# Patient Record
Sex: Female | Born: 1955 | Race: White | Hispanic: No | Smoking: Former smoker
Health system: Southern US, Community
[De-identification: ages and names within clinical notes are randomized; demographics above are authoritative.]

## PROBLEM LIST (undated history)

## (undated) DIAGNOSIS — C801 Malignant (primary) neoplasm, unspecified: Secondary | ICD-10-CM

## (undated) DIAGNOSIS — R112 Nausea with vomiting, unspecified: Secondary | ICD-10-CM

## (undated) DIAGNOSIS — T8859XA Other complications of anesthesia, initial encounter: Secondary | ICD-10-CM

## (undated) DIAGNOSIS — F988 Other specified behavioral and emotional disorders with onset usually occurring in childhood and adolescence: Secondary | ICD-10-CM

## (undated) DIAGNOSIS — Z9889 Other specified postprocedural states: Secondary | ICD-10-CM

## (undated) DIAGNOSIS — T4145XA Adverse effect of unspecified anesthetic, initial encounter: Secondary | ICD-10-CM

## (undated) DIAGNOSIS — I10 Essential (primary) hypertension: Secondary | ICD-10-CM

## (undated) HISTORY — PX: MELANOMA EXCISION WITH SENTINEL LYMPH NODE BIOPSY: SHX5267

## (undated) HISTORY — PX: JOINT REPLACEMENT: SHX530

## (undated) HISTORY — PX: ABDOMINAL HYSTERECTOMY: SHX81

## (undated) HISTORY — PX: AUGMENTATION MAMMAPLASTY: SUR837

---

## 1998-07-03 ENCOUNTER — Encounter: Payer: Self-pay | Admitting: General Surgery

## 1998-07-03 ENCOUNTER — Ambulatory Visit (HOSPITAL_COMMUNITY): Admission: RE | Admit: 1998-07-03 | Discharge: 1998-07-03 | Payer: Self-pay | Admitting: General Surgery

## 1998-07-11 ENCOUNTER — Encounter: Payer: Self-pay | Admitting: General Surgery

## 1998-07-17 ENCOUNTER — Ambulatory Visit (HOSPITAL_COMMUNITY): Admission: RE | Admit: 1998-07-17 | Discharge: 1998-07-18 | Payer: Self-pay | Admitting: General Surgery

## 1998-09-05 ENCOUNTER — Other Ambulatory Visit: Admission: RE | Admit: 1998-09-05 | Discharge: 1998-09-05 | Payer: Self-pay | Admitting: Obstetrics and Gynecology

## 1999-09-09 ENCOUNTER — Other Ambulatory Visit: Admission: RE | Admit: 1999-09-09 | Discharge: 1999-09-09 | Payer: Self-pay | Admitting: Obstetrics and Gynecology

## 2000-01-07 ENCOUNTER — Encounter: Admission: RE | Admit: 2000-01-07 | Discharge: 2000-01-07 | Payer: Self-pay | Admitting: Hematology and Oncology

## 2000-01-07 ENCOUNTER — Encounter: Payer: Self-pay | Admitting: Hematology and Oncology

## 2000-09-15 ENCOUNTER — Other Ambulatory Visit: Admission: RE | Admit: 2000-09-15 | Discharge: 2000-09-15 | Payer: Self-pay | Admitting: Obstetrics and Gynecology

## 2001-10-18 ENCOUNTER — Other Ambulatory Visit: Admission: RE | Admit: 2001-10-18 | Discharge: 2001-10-18 | Payer: Self-pay | Admitting: Obstetrics and Gynecology

## 2002-01-24 ENCOUNTER — Encounter: Payer: Self-pay | Admitting: Hematology and Oncology

## 2002-01-24 ENCOUNTER — Ambulatory Visit (HOSPITAL_COMMUNITY): Admission: RE | Admit: 2002-01-24 | Discharge: 2002-01-24 | Payer: Self-pay | Admitting: Hematology and Oncology

## 2002-08-03 ENCOUNTER — Encounter: Admission: RE | Admit: 2002-08-03 | Discharge: 2002-08-03 | Payer: Self-pay | Admitting: Internal Medicine

## 2002-08-03 ENCOUNTER — Encounter: Payer: Self-pay | Admitting: Internal Medicine

## 2002-12-26 ENCOUNTER — Other Ambulatory Visit: Admission: RE | Admit: 2002-12-26 | Discharge: 2002-12-26 | Payer: Self-pay | Admitting: Obstetrics and Gynecology

## 2008-11-08 ENCOUNTER — Encounter (INDEPENDENT_AMBULATORY_CARE_PROVIDER_SITE_OTHER): Payer: Self-pay | Admitting: Obstetrics and Gynecology

## 2008-11-08 ENCOUNTER — Ambulatory Visit (HOSPITAL_COMMUNITY): Admission: RE | Admit: 2008-11-08 | Discharge: 2008-11-09 | Payer: Self-pay | Admitting: Obstetrics and Gynecology

## 2010-09-20 ENCOUNTER — Encounter (INDEPENDENT_AMBULATORY_CARE_PROVIDER_SITE_OTHER): Payer: Self-pay | Admitting: *Deleted

## 2010-09-24 NOTE — Letter (Signed)
Summary: Pre Visit Letter Revised  [facility]  [address] [place], [place] [address]   Phone: [phone]  Fax: [phone]        09/20/2010 MRN: [id number] [name] [address] [place], [place]  [address]             Procedure Date:  10-31-10           Direct Colon---Dr. Juanda Chance   Welcome to the Gastroenterology Division at [facility].    You are scheduled to see a nurse for your pre-procedure visit on 10-17-10 at 11:00a.m. on the 3rd floor at [facility], [address]  We ask that you try to arrive at our office 15 minutes prior to your appointment time to allow for check-in.  Please take a minute to review the attached form.  If you answer "Yes" to one or more of the questions on the first page, we ask that you call the person listed at your earliest opportunity.  If you answer "No" to all of the questions, please complete the rest of the form and bring it to your appointment.    Your nurse visit will consist of discussing your medical and surgical history, your immediate family medical history, and your medications.   If you are unable to list all of your medications on the form, please bring the medication bottles to your appointment and we will list them.  We will need to be aware of both prescribed and over the counter drugs.  We will need to know exact dosage information as well.    Please be prepared to read and sign documents such as consent forms, a financial agreement, and acknowledgement forms.  If necessary, and with your consent, a friend or relative is welcome to sit-in on the nurse visit with you.  Please bring your insurance card so that we may make a copy of it.  If your insurance requires a referral to see a specialist, please bring your referral form from your primary care physician.  No co-pay is required for this nurse visit.     If you cannot keep your appointment, please call [phone] to cancel or reschedule prior to  your appointment date.  This allows [place] the opportunity to schedule an appointment for another patient in need of care.    Thank you for choosing  Gastroenterology for your medical needs.  We appreciate the opportunity to care for you.  Please visit [place] at our website  to learn more about our practice.  Sincerely, The Gastroenterology Division

## 2010-10-23 ENCOUNTER — Ambulatory Visit (AMBULATORY_SURGERY_CENTER): Payer: 59 | Admitting: *Deleted

## 2010-10-23 VITALS — Ht 65.0 in | Wt 120.0 lb

## 2010-10-23 DIAGNOSIS — Z1211 Encounter for screening for malignant neoplasm of colon: Secondary | ICD-10-CM

## 2010-10-23 LAB — COMPREHENSIVE METABOLIC PANEL
ALT: 15 U/L (ref 0–35)
Alkaline Phosphatase: 49 U/L (ref 39–117)
BUN: 11 mg/dL (ref 6–23)
CO2: 27 mEq/L (ref 19–32)
Calcium: 9 mg/dL (ref 8.4–10.5)
GFR calc non Af Amer: >60 mL/min (ref >60)
Glucose, Bld: 131 mg/dL — ABNORMAL HIGH (ref 70–99)
Potassium: 3.9 mEq/L (ref 3.5–5.1)
Sodium: 139 mEq/L (ref 135–145)
Total Protein: 6.8 g/dL (ref 6.0–8.3)

## 2010-10-23 LAB — CBC
HCT: 27.9 % — ABNORMAL LOW (ref 36.0–46.0)
HCT: 43.4 % (ref 36.0–46.0)
Hemoglobin: 15.1 g/dL — ABNORMAL HIGH (ref 12.0–15.0)
Hemoglobin: 9 g/dL — ABNORMAL LOW (ref 12.0–15.0)
Hemoglobin: 9.8 g/dL — ABNORMAL LOW (ref 12.0–15.0)
MCHC: 34.8 g/dL (ref 30.0–36.0)
MCHC: 35.1 g/dL (ref 30.0–36.0)
MCV: 97.7 fL (ref 78.0–100.0)
RBC: 2.66 MIL/uL — ABNORMAL LOW (ref 3.87–5.11)
RBC: 4.5 MIL/uL (ref 3.87–5.11)
RDW: 13.1 % (ref 11.5–15.5)
RDW: 13.2 % (ref 11.5–15.5)
WBC: 7 10*3/uL (ref 4.0–10.5)

## 2010-10-23 LAB — DIFFERENTIAL
Basophils Relative: 1 % (ref 0–1)
Eosinophils Absolute: 0.3 10*3/uL (ref 0.0–0.7)
Monocytes Relative: 9 % (ref 3–12)
Neutro Abs: 4.4 10*3/uL (ref 1.7–7.7)
Neutrophils Relative %: 67 % (ref 43–77)

## 2010-10-23 LAB — GLUCOSE, RANDOM: Glucose, Bld: 111 mg/dL — ABNORMAL HIGH (ref 70–99)

## 2010-10-23 MED ORDER — PEG-KCL-NACL-NASULF-NA ASC-C 100 G PO SOLR: ORAL | Status: DC

## 2010-10-31 ENCOUNTER — Other Ambulatory Visit: Payer: Self-pay | Admitting: Internal Medicine

## 2010-11-05 ENCOUNTER — Encounter: Payer: Self-pay | Admitting: Internal Medicine

## 2010-11-06 ENCOUNTER — Ambulatory Visit (AMBULATORY_SURGERY_CENTER): Payer: 59 | Admitting: Internal Medicine

## 2010-11-06 ENCOUNTER — Encounter: Payer: Self-pay | Admitting: Internal Medicine

## 2010-11-06 VITALS — BP 145/97 | HR 99 | Temp 98.3°F | Resp 16 | Ht 65.0 in | Wt 120.0 lb

## 2010-11-06 DIAGNOSIS — Z1211 Encounter for screening for malignant neoplasm of colon: Secondary | ICD-10-CM

## 2010-11-06 MED ORDER — SODIUM CHLORIDE 0.9 % IV SOLN: 500.0000 mL | INTRAVENOUS | Status: DC

## 2010-11-06 NOTE — Patient Instructions (Signed)
Findings: Normal  Please review discharge instructions

## 2010-11-07 ENCOUNTER — Telehealth: Payer: Self-pay | Admitting: *Deleted

## 2010-11-07 NOTE — Telephone Encounter (Signed)

## 2010-11-26 NOTE — Op Note (Signed)
[name], [name]                ACCOUNT NO.:  [id number]   MEDICAL RECORD NO.:  [id number]          PATIENT TYPE:  INP   LOCATION:  9399                          FACILITY:  [facility]   PHYSICIAN:  Sherry A. Dickstein, M.D.DATE OF BIRTH:  03/28/1956   DATE OF PROCEDURE:  11/08/2008  DATE OF DISCHARGE:                               OPERATIVE REPORT   PREOPERATIVE DIAGNOSIS:  Fibroid uterus.   POSTOPERATIVE DIAGNOSIS:  Fibroid uterus.   PROCEDURE:  Supracervical hysterectomy.   SURGEON:  Sherry A. Rosalio Macadamia, MD   ASSISTANT:  Lendon Colonel, MD   ANESTHESIA:  Epidural.   INDICATIONS:  This is a 55 year old G2, P2-0-0-2 woman who is  postmenopausal; however, the patient has had known fibroids for many  years.  Her fibroids are now very uncomfortable for her causing her to  have significant abdominal pressure and vaginal pressure from them.  At  this time, the patient requests having the fibroids removed and having a  supracervical hysterectomy.  The patient would like to have her ovaries  remain in place and would like to have her cervix remained in place.  Therefore, the patient is brought to the operating room for  supracervical hysterectomy.   FINDINGS:  A 16-18-week size uterus, fallopian tubes and ovaries within  normal limits, upper abdominal exploration normal.   PROCEDURE:  The patient was brought into the operating room and given  adequate epidural anesthesia.  She was placed in a frog-leg position.  Her abdomen and then her vagina were washed with Betadine.  Foley  catheter was placed within the bladder.  The patient was taken out of  frog-leg position.  Surgeon's gown and gloves were changed.  The patient  was draped in sterile fashion.  The suprapubic area was checked with  Allis clamps.  Adequate anesthetic level was obtained.  A Pfannenstiel  incision was made.  Bleeders were cauterized.  Fascia was identified and  fascia was incised sharply.  Fascia was dissected  off of the muscles  with cautery.  Bleeders were cauterized.  The muscles were separated.  The peritoneum was identified.  The peritoneal incision was made first  bluntly and then opened sharply and with cautery.  The abdomen was  inspected and was visualized.  The abdomen was palpated.  There were no  abnormalities found other than the fibroid uterus.  The fascia was slung  up for visualization.  It was determined that the uterus was too big to  deliver through the incision.  The left round ligament was identified.  It was cauterized using the LigaSure and incised.  The left utero-  ovarian ligaments were cauterized in successive areas down to the broad  ligament.  The anterior leaf of the broad ligament was incised across  the midline.  The bladder flap was developed along the side.  The area  of the right round ligament was then identified.  This was cauterized  using the LigaSure and incised.  The right ovary and fallopian tube was  splayed across the uterus across the fibroids.  It was determined that  the fallopian  tube would stay with the uterus and therefore the vessels  of the right ovary were cauterized and cut.  The right utero-ovarian  vessel was cauterized and cut to allow the right ovary to fall away from  the uterus.  Then, the leaf of the broad ligament was incised and the  bladder flap was developed with blunt and sharp dissection.  The right  cardinal ligaments were identified.  They were clamped with Heaney  clamps and then also cauterized with LigaSure.  After being cauterized  with the LigaSure and cut, each clamp was suture ligated with 0 Vicryl  ligature.  This was done on successive bites down to the cervix.  The  uterine arteries were taken in this fashion and that they were  cauterized for backbleeding, then cut, and then suture ligated with 0  Vicryl ligature.  The same procedure was then performed on the left.  The cardinal ligaments were clamped with  Masterson's, cauterized with  LigaSure, cut, and then suture ligated with 0 Vicryl ligatures.  This  was done down to the cervix.  Adequate hemostasis was present.  Once the  cervix was well identified, the uterus was removed from the cervix using  cautery.  The uterus was removed in this fashion and passed off the  table.  A small wedge-shaped piece of cervical tissue was removed from  the cervix.  The cervical canal was cauterized.  Adequate hemostasis was  present.  The cervix was then closed using 0 Vicryl in figure-of-eight  stitches.  It showed minimal bleeding site that was reclosed with 0  Vicryl ligatures and any other small bleeders were cauterized.  The  stump of the cervix was closed over with the extraperitoneal tissue and  this was closed with a 2-0 Vicryl in an interrupted stitch.  All  pedicles were inspected and felt to have adequate hemostasis.  The utero-  ovarian pedicles were dry.  There was some thought to try to pull them  out by the pelvis.  However, because of the way they were cauterized, it  was felt that it would just make them bleed if they were tacked up out  of the pelvis.  Therefore, the pelvis was then irrigated with large  amounts of warm saline.  The packs that had been placed prior to the  surgery were all removed.  The upper abdomen was inspected and felt to  be normal.  All packs had been removed.  There was no bleeding along any  of the peritoneal edges.  There was no bleeding under the fascia or  between the pyramidalis muscles.  The fascia was then closed with 0  Vicryl in 2 stitches running from laterally to the midline.  Adequate  hemostasis was present.  The incision was irrigated again.  The fat  tissue was then cauterized for any small bleeders and irrigated.  The  skin was closed with 4-0 Monocryl in a subcuticular running stitch.  A  bandage was placed over the wound.  The patient had been awake during  the procedure and she was then moved  from the operating table to a  stretcher in stable condition.   COMPLICATIONS:  None.   ESTIMATED BLOOD LOSS:  150 mL.      Sherry A. Rosalio Macadamia, M.D.  Electronically Signed     SAD/MEDQ  D:  11/08/2008  T:  11/08/2008  Job:  [id number]

## 2016-04-30 ENCOUNTER — Encounter: Payer: Self-pay | Admitting: Family Medicine

## 2016-04-30 ENCOUNTER — Ambulatory Visit (INDEPENDENT_AMBULATORY_CARE_PROVIDER_SITE_OTHER): Payer: Commercial Managed Care - HMO | Admitting: Family Medicine

## 2016-04-30 DIAGNOSIS — M25552 Pain in left hip: Secondary | ICD-10-CM | POA: Diagnosis not present

## 2016-04-30 NOTE — Patient Instructions (Signed)
I'm concerned you have a labral tear of your left hip. Start physical therapy and do home exercises on days you don't go to therapy. You can start with the hip flexion, straight leg raises, side raises, inside leg raises 3 sets of 10 once a day. Tylenol, ibuprofen as needed (these are unlikely to help you improve faster). You've already tried a cortisone shot for this. Follow up with me in 5-6 weeks. If not improving would consider an MRI arthrogram of this hip.

## 2016-05-04 DIAGNOSIS — M25552 Pain in left hip: Secondary | ICD-10-CM | POA: Insufficient documentation

## 2016-05-04 NOTE — Assessment & Plan Note (Signed)
concerning for labral tear of left hip.  S/p intraarticular injection without much benefit - would expect good response if this was mild arthritis.  Advised going ahead with physical therapy.  Tylenol or ibuprofen if needed.  F/u in 5-6 weeks.  Consider MR arthrogram if not improving as expected.

## 2016-05-04 NOTE — Progress Notes (Signed)
PCP: No primary care provider on file.  Subjective:   HPI: Patient is a 60 y.o. female here for left hip pain.  Patient denies known injury or trauma. Patient reports she recalls getting left hip/groin pain when she was working out - does a Ambulance person camp workouts. Pain is 0/10 at rest but up to 7/10 with movements, sharp. Has tried resting, stretching, nsaids without much benefit. No numbness or tingling Does get some dull pain in calf. No back pain. She tried intraarticular injection without much benefit per her report. No skin changes.  No past medical history on file.  Current Outpatient Prescriptions on File Prior to Visit  Medication Sig Dispense Refill  . Calcium 500 MG CHEW Chew 1,000 mg by mouth daily. Takes 2-3 daily     . fish oil-omega-3 fatty acids 1000 MG capsule Take 1 g by mouth daily.      . fluticasone (FLONASE) 50 MCG/ACT nasal spray 2 sprays by Nasal route daily.      . Multiple Vitamins-Minerals (MULTIVITAMIN WITH MINERALS) tablet Take 1 tablet by mouth daily.      . peg 3350 powder (MOVIPREP) 100 G SOLR MOVI PREP take as directed 1 kit 0   Current Facility-Administered Medications on File Prior to Visit  Medication Dose Route Frequency Provider Last Rate Last Dose  . 0.9 %  sodium chloride infusion  500 mL Intravenous Continuous Lafayette Dragon, MD        Past Surgical History:  Procedure Laterality Date  . ABDOMINAL HYSTERECTOMY    . AUGMENTATION MAMMAPLASTY    . MELANOMA EXCISION WITH SENTINEL LYMPH NODE BIOPSY     1995    Allergies  Allergen Reactions  . Sulfa Antibiotics Other (See Comments)    Unknown was as a child    Social History   Social History  . Marital status: Divorced    Spouse name: N/A  . Number of children: N/A  . Years of education: N/A   Occupational History  . Not on file.   Social History Main Topics  . Smoking status: Former Research scientist (life sciences)  . Smokeless tobacco: Never Used  . Alcohol use 2.4 oz/week    4 Glasses of wine  per week  . Drug use: No  . Sexual activity: Not on file   Other Topics Concern  . Not on file   Social History Narrative  . No narrative on file    No family history on file.  BP 122/82   Pulse 97   Ht 5' 5"  (1.651 m)   Wt 120 lb (54.4 kg)   BMI 19.97 kg/m   Review of Systems: See HPI above.    Objective:  Physical Exam:  Gen: NAD, comfortable in exam room  Back/left hip: No gross deformity, scoliosis. No TTP. FROM but pain with hip motions. Strength LEs 5/5 all muscle groups without reproducing pain.   2+ MSRs in patellar and achilles tendons, equal bilaterally. Negative SLRs. Sensation intact to light touch bilaterally. Positive logroll but no clunk left hip. Negative fabers and piriformis stretches.    Assessment & Plan:  1. Left hip pain - concerning for labral tear of left hip.  S/p intraarticular injection without much benefit - would expect good response if this was mild arthritis.  Advised going ahead with physical therapy.  Tylenol or ibuprofen if needed.  F/u in 5-6 weeks.  Consider MR arthrogram if not improving as expected.

## 2016-06-23 ENCOUNTER — Ambulatory Visit: Payer: Commercial Managed Care - HMO | Admitting: Family Medicine

## 2016-07-03 ENCOUNTER — Encounter: Payer: Self-pay | Admitting: Family Medicine

## 2016-07-03 ENCOUNTER — Ambulatory Visit (INDEPENDENT_AMBULATORY_CARE_PROVIDER_SITE_OTHER): Payer: Commercial Managed Care - HMO | Admitting: Family Medicine

## 2016-07-03 ENCOUNTER — Ambulatory Visit: Payer: Commercial Managed Care - HMO | Admitting: Family Medicine

## 2016-07-03 DIAGNOSIS — M25552 Pain in left hip: Secondary | ICD-10-CM | POA: Diagnosis not present

## 2016-07-03 NOTE — Patient Instructions (Signed)
We will go ahead with an MRI arthrogram of your hip to assess for labral tear.

## 2016-07-10 NOTE — Assessment & Plan Note (Signed)
concerning for labral tear of left hip.  Intraarticular injection and rehabilitation exercises have not helped unfortunately.  Will go ahead with MR arthrogram to further assess.  Tylenol or ibuprofen if needed.

## 2016-07-10 NOTE — Progress Notes (Addendum)
PCP: No primary care provider on file.  Subjective:   HPI: Patient is a 60 y.o. female here for left hip pain.  10/18: Patient denies known injury or trauma. Patient reports she recalls getting left hip/groin pain when she was working out - does a Ambulance person camp workouts. Pain is 0/10 at rest but up to 7/10 with movements, sharp. Has tried resting, stretching, nsaids without much benefit. No numbness or tingling Does get some dull pain in calf. No back pain. She tried intraarticular injection without much benefit per her report. No skin changes.  12/21: Patient returns having done home exercises, stretches for IT band. Unfortunately continues to have pain in left hip/groin. Can radiate anteriorly down this leg. Worse with squatting, up to 8/10. Improves with sitting. No skin changes, numbness.  No past medical history on file.  Current Outpatient Prescriptions on File Prior to Visit  Medication Sig Dispense Refill  . amphetamine-dextroamphetamine (ADDERALL) 20 MG tablet Take 20 mg by mouth every morning.  0  . Calcium 500 MG CHEW Chew 1,000 mg by mouth daily. Takes 2-3 daily     . ESTRACE VAGINAL 0.1 MG/GM vaginal cream APPLY 1 GRAM VAGINALLY 3 TIMES WEEKLY  12  . fish oil-omega-3 fatty acids 1000 MG capsule Take 1 g by mouth daily.      . fluticasone (FLONASE) 50 MCG/ACT nasal spray 2 sprays by Nasal route daily.      . Multiple Vitamins-Minerals (MULTIVITAMIN WITH MINERALS) tablet Take 1 tablet by mouth daily.      . peg 3350 powder (MOVIPREP) 100 G SOLR MOVI PREP take as directed 1 kit 0   Current Facility-Administered Medications on File Prior to Visit  Medication Dose Route Frequency Provider Last Rate Last Dose  . 0.9 %  sodium chloride infusion  500 mL Intravenous Continuous Lafayette Dragon, MD        Past Surgical History:  Procedure Laterality Date  . ABDOMINAL HYSTERECTOMY    . AUGMENTATION MAMMAPLASTY    . MELANOMA EXCISION WITH SENTINEL LYMPH NODE BIOPSY      1995    Allergies  Allergen Reactions  . Sulfa Antibiotics Other (See Comments)    Unknown was as a child    Social History   Social History  . Marital status: Divorced    Spouse name: N/A  . Number of children: N/A  . Years of education: N/A   Occupational History  . Not on file.   Social History Main Topics  . Smoking status: Former Research scientist (life sciences)  . Smokeless tobacco: Never Used  . Alcohol use 2.4 oz/week    4 Glasses of wine per week  . Drug use: No  . Sexual activity: Not on file   Other Topics Concern  . Not on file   Social History Narrative  . No narrative on file    No family history on file.  BP 135/87   Pulse 85   Ht 5' 5" (1.651 m)   Wt 120 lb (54.4 kg)   BMI 19.97 kg/m   Review of Systems: See HPI above.    Objective:  Physical Exam:  Gen: NAD, comfortable in exam room  Back/left hip: No gross deformity, scoliosis. No TTP. FROM but pain with hip motions. Strength LEs 5/5 all muscle groups without reproducing pain.   Negative SLRs. Positive logroll but no clunk left hip.    Assessment & Plan:  1. Left hip pain - concerning for labral tear of left hip.  Intraarticular injection and rehabilitation exercises have not helped unfortunately.  Will go ahead with MR arthrogram to further assess.  Tylenol or ibuprofen if needed.    Addendum:  MR arthrogram reviewed and discussed with patient.  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  Discussed options and answered questions - will refer to ortho to discuss surgical intervention.

## 2016-07-15 NOTE — Addendum Note (Signed)
Addended by: Sherrie George F on: 07/15/2016 12:31 PM   Modules accepted: Orders

## 2016-08-18 ENCOUNTER — Ambulatory Visit
Admission: RE | Admit: 2016-08-18 | Discharge: 2016-08-18 | Disposition: A | Discharge status: Home / Self Care | Payer: Commercial Managed Care - HMO | Source: Ambulatory Visit | Attending: Family Medicine | Admitting: Family Medicine

## 2016-08-18 DIAGNOSIS — M25552 Pain in left hip: Secondary | ICD-10-CM

## 2016-08-18 MED ORDER — IOPAMIDOL (ISOVUE-M 200) INJECTION 41%
12.0000 mL | Freq: Once | INTRAMUSCULAR | Status: AC
  Administered 2016-08-18: 12 mL via INTRA_ARTICULAR

## 2017-05-14 ENCOUNTER — Ambulatory Visit: Payer: Self-pay | Admitting: Orthopedic Surgery

## 2017-05-30 ENCOUNTER — Ambulatory Visit: Payer: Self-pay | Admitting: Orthopedic Surgery

## 2017-05-30 NOTE — H&P (View-Only) (Signed)
[name] DOB: 06/02/1956 Divorced / Language: [name] / Race: White Female Date of admission: June 17, 2017  Chief complaint: Left hip pain History of Present Illness ([name] L. Robin Klinker III PA-C; 05/28/2017 5:31 PM) The patient is a 61 year old female who comes in today for a preoperative History and Physical. The patient is scheduled for a left total hip arthroplasty (anterior) to be performed by Dr. Dione Plover. [name], MD at [facility] on 06-17-2017 The patient is a 61 year old female who reported left anterior hip problems including pain symptoms that have been present for month(s) (a little over a year). The symptoms began in association with an established activity (working out). Symptoms reported include pain with weightbearing and night pain The patient reports symptoms radiating to the: left thigh anteriorly. The patient describes the hip problem as sharp, dull and aching. The patient feels as if their symptoms are does feel they are worsening. Current treatment includes nonsteroidal anti-inflammatory drugs (Ibuprofen). Prior to being seen the patient was previously evaluated by an out of town physician ([place], sports medicine in [place]). Previous workup for this problem has included hip x-rays and hip MRI. Previous treatment for this problem has included corticosteroid injection (no help) and physical therapy. [name] reported progressively worsening problems with her LEFT hip. Pain began approximately a year ago but has gotten to the point where it is hurting her at all times. She tries to remain extremely active at the hip is now preventing her from doing so. It is limiting what she can and cannot do. She has lost a lot of movement in the hip and also is having considerable pain. Pain initially was just activity related but now is occurring more at rest and even at night. She has had an intra-articular injection without any lasting benefit. She is ready to get something  done about the hip. We reviewed AP pelvis and LEFT hip MRI taken previously. She did have significant joint space narrowing and on the MRI did have significant degenerative change. The AP pelvis and lateral LEFT hip taken in the office showed that she now has bone-on-bone arthritis of the LEFT hip with subchondral cystic degeneration. She does have advanced end-stage arthritis of the LEFT hip with progressively worsening pain and dysfunction. She did not have good benefit from an intra-articular injection in the past. The patient has significant pain and dysfunction in their hip. They have significant functional limitations and have failed nonoperative management. It is felt that she would benefit from undergoing surgery. They have been treated conservatively in the past for the above stated problem and despite conservative measures, they continue to have progressive pain and severe functional limitations and dysfunction. They have failed non-operative management including home exercise, medications, and injections. It is felt that they would benefit from undergoing total joint replacement. Risks and benefits of the procedure have been discussed with the patient and they elect to proceed with surgery. There are no active contraindications to surgery such as ongoing infection or rapidly progressive neurological disease.   Problem List/Past Medical Pain of left hip joint (M25.552)  Primary osteoarthritis of left hip (M16.12)   Allergies No Known Drug Allergies  Family History  Chronic Obstructive Lung Disease  Mother. Congestive Heart Failure  Mother. Osteoporosis  Mother. Rheumatoid Arthritis  Maternal Grandmother. Father  Deceased. age 57 Mother  Deceased. age 61  Social History  Children  2 Current drinker  03/05/2017: Currently drinks wine only occasionally per week Current work status  working full time Living situation  live alone Marital status  divorced No history of  drug/alcohol rehab  Not under pain contract  Number of flights of stairs before winded  4-5 Tobacco / smoke exposure  03/05/2017: no Tobacco use  Never smoker. 03/05/2017 Post-Surgical Plans  Home With Caregiver.  Medication History  Amphetamine-Dextroamphetamine (20MG  Tablet, Oral) Active. Advil Active. Multivitamin Active.   Past Surgical History  Hysterectomy  partial (non-cancerous)   Review of Systems  General Not Present- Chills, Fatigue, Fever, Memory Loss, Night Sweats, Weight Gain and Weight Loss. Skin Not Present- Eczema, Hives, Itching, Lesions and Rash. HEENT Not Present- Dentures, Double Vision, Headache, Hearing Loss, Tinnitus and Visual Loss. Respiratory Not Present- Allergies, Chronic Cough, Coughing up blood, Shortness of breath at rest and Shortness of breath with exertion. Cardiovascular Not Present- Chest Pain, Difficulty Breathing Lying Down, Murmur, Palpitations, Racing/skipping heartbeats and Swelling. Gastrointestinal Not Present- Abdominal Pain, Bloody Stool, Constipation, Diarrhea, Difficulty Swallowing, Heartburn, Jaundice, Loss of appetitie, Nausea and Vomiting. Female Genitourinary Not Present- Blood in Urine, Discharge, Flank Pain, Incontinence, Painful Urination, Urgency, Urinary frequency, Urinary Retention, Urinating at Night and Weak urinary stream. Musculoskeletal Present- Joint Pain. Not Present- Back Pain, Joint Swelling, Morning Stiffness, Muscle Pain, Muscle Weakness and Spasms. Neurological Not Present- Blackout spells, Difficulty with balance, Dizziness, Paralysis, Tremor and Weakness. Psychiatric Not Present- Insomnia.  Vitals  Weight: 120 lb Height: 66in Weight was reported by patient. Height was reported by patient. Body Surface Area: 1.61 m Body Mass Index: 19.37 kg/m  Pulse: 84 (Regular)  BP: 142/82 (Sitting, Right Arm, Standard)   Physical Exam General Mental Status -Alert, cooperative and good  historian. General Appearance-pleasant, Not in acute distress. Orientation-Oriented X3. Build & Nutrition-Well nourished and Well developed.  Head and Neck Head-normocephalic, atraumatic . Neck Global Assessment - supple, no bruit auscultated on the right, no bruit auscultated on the left.  Eye Pupil - Bilateral-Regular and Round. Motion - Bilateral-EOMI.  Chest and Lung Exam Auscultation Breath sounds - clear at anterior chest wall and clear at posterior chest wall. Adventitious sounds - No Adventitious sounds.  Cardiovascular Auscultation Rhythm - Regular rate and rhythm. Heart Sounds - S1 WNL and S2 WNL. Murmurs & Other Heart Sounds - Auscultation of the heart reveals - No Murmurs.  Abdomen Palpation/Percussion Tenderness - Abdomen is non-tender to palpation. Rigidity (guarding) - Abdomen is soft. Auscultation Auscultation of the abdomen reveals - Bowel sounds normal.  Female Genitourinary Note: Not done, not pertinent to present illness   Musculoskeletal Note: Examination of the right hip shows flexion to 120 rotation in 30 abduction 40 and external rotation of 40. There is no tenderness over the greater trochanter. There is no pain on provocative testing of the hip. Her LEFT hip can be flexed to 90 with no internal rotation, 20 of external rotation, and 20 of abduction. She has pain on attempted range of motion of the LEFT hip. Her LEFT knee exam is normal. Pulses, sensation, and motor are intact both lower extremities. She has a significantly antalgic gait pattern on the LEFT.  We reviewed AP pelvis and LEFT hip MRI taken previously. She did have significant joint space narrowing and on the MRI did have significant degenerative change. AP pelvis and lateral LEFT hip taken in the office shows that she now has bone-on-bone arthritis of the LEFT hip with subchondral cystic degeneration.  Assessment & Plan Primary osteoarthritis of left hip  (M16.12)  Note:Surgical Plans: Left Total Hip Replacement - Anterior Approach  Disposition:  Home with help  PCP: Dr. Burnett Sheng - pending  IV TXA  Anesthesia Issues: Nausea in the past  Patient was instructed on what medications to stop prior to surgery.  Signed electronically by Joelene Millin, III PA-C

## 2017-05-30 NOTE — H&P (Signed)
[name] DOB: 1956/04/01 Divorced / Language: [name] / Race: White Female Date of admission: June 17, 2017  Chief complaint: Left hip pain History of Present Illness (Alexzandrew L. Perkins III PA-C; 05/28/2017 5:31 PM) The patient is a 61 year old female who comes in today for a preoperative History and Physical. The patient is scheduled for a left total hip arthroplasty (anterior) to be performed by Dr. Dione Plover. [name], MD at [facility] on 06-17-2017 The patient is a 61 year old female who reported left anterior hip problems including pain symptoms that have been present for month(s) (a little over a year). The symptoms began in association with an established activity (working out). Symptoms reported include pain with weightbearing and night pain The patient reports symptoms radiating to the: left thigh anteriorly. The patient describes the hip problem as sharp, dull and aching. The patient feels as if their symptoms are does feel they are worsening. Current treatment includes nonsteroidal anti-inflammatory drugs (Ibuprofen). Prior to being seen the patient was previously evaluated by an out of town physician ([place], sports medicine in [place]). Previous workup for this problem has included hip x-rays and hip MRI. Previous treatment for this problem has included corticosteroid injection (no help) and physical therapy. Vianney reported progressively worsening problems with her LEFT hip. Pain began approximately a year ago but has gotten to the point where it is hurting her at all times. She tries to remain extremely active at the hip is now preventing her from doing so. It is limiting what she can and cannot do. She has lost a lot of movement in the hip and also is having considerable pain. Pain initially was just activity related but now is occurring more at rest and even at night. She has had an intra-articular injection without any lasting benefit. She is ready to get something  done about the hip. We reviewed AP pelvis and LEFT hip MRI taken previously. She did have significant joint space narrowing and on the MRI did have significant degenerative change. The AP pelvis and lateral LEFT hip taken in the office showed that she now has bone-on-bone arthritis of the LEFT hip with subchondral cystic degeneration. She does have advanced end-stage arthritis of the LEFT hip with progressively worsening pain and dysfunction. She did not have good benefit from an intra-articular injection in the past. The patient has significant pain and dysfunction in their hip. They have significant functional limitations and have failed nonoperative management. It is felt that she would benefit from undergoing surgery. They have been treated conservatively in the past for the above stated problem and despite conservative measures, they continue to have progressive pain and severe functional limitations and dysfunction. They have failed non-operative management including home exercise, medications, and injections. It is felt that they would benefit from undergoing total joint replacement. Risks and benefits of the procedure have been discussed with the patient and they elect to proceed with surgery. There are no active contraindications to surgery such as ongoing infection or rapidly progressive neurological disease.   Problem List/Past Medical Pain of left hip joint (M25.552)  Primary osteoarthritis of left hip (M16.12)   Allergies No Known Drug Allergies  Family History  Chronic Obstructive Lung Disease  Mother. Congestive Heart Failure  Mother. Osteoporosis  Mother. Rheumatoid Arthritis  Maternal Grandmother. Father  Deceased. age 50 Mother  Deceased. age 61  Social History  Children  2 Current drinker  03/05/2017: Currently drinks wine only occasionally per week Current work status  working full time Living situation  live alone Marital status  divorced No history of  drug/alcohol rehab  Not under pain contract  Number of flights of stairs before winded  4-5 Tobacco / smoke exposure  03/05/2017: no Tobacco use  Never smoker. 03/05/2017 Post-Surgical Plans  Home With Caregiver.  Medication History  Amphetamine-Dextroamphetamine (20MG  Tablet, Oral) Active. Advil Active. Multivitamin Active.   Past Surgical History  Hysterectomy  partial (non-cancerous)   Review of Systems  General Not Present- Chills, Fatigue, Fever, Memory Loss, Night Sweats, Weight Gain and Weight Loss. Skin Not Present- Eczema, Hives, Itching, Lesions and Rash. HEENT Not Present- Dentures, Double Vision, Headache, Hearing Loss, Tinnitus and Visual Loss. Respiratory Not Present- Allergies, Chronic Cough, Coughing up blood, Shortness of breath at rest and Shortness of breath with exertion. Cardiovascular Not Present- Chest Pain, Difficulty Breathing Lying Down, Murmur, Palpitations, Racing/skipping heartbeats and Swelling. Gastrointestinal Not Present- Abdominal Pain, Bloody Stool, Constipation, Diarrhea, Difficulty Swallowing, Heartburn, Jaundice, Loss of appetitie, Nausea and Vomiting. Female Genitourinary Not Present- Blood in Urine, Discharge, Flank Pain, Incontinence, Painful Urination, Urgency, Urinary frequency, Urinary Retention, Urinating at Night and Weak urinary stream. Musculoskeletal Present- Joint Pain. Not Present- Back Pain, Joint Swelling, Morning Stiffness, Muscle Pain, Muscle Weakness and Spasms. Neurological Not Present- Blackout spells, Difficulty with balance, Dizziness, Paralysis, Tremor and Weakness. Psychiatric Not Present- Insomnia.  Vitals  Weight: 120 lb Height: 66in Weight was reported by patient. Height was reported by patient. Body Surface Area: 1.61 m Body Mass Index: 19.37 kg/m  Pulse: 84 (Regular)  BP: 142/82 (Sitting, Right Arm, Standard)   Physical Exam General Mental Status -Alert, cooperative and good  historian. General Appearance-pleasant, Not in acute distress. Orientation-Oriented X3. Build & Nutrition-Well nourished and Well developed.  Head and Neck Head-normocephalic, atraumatic . Neck Global Assessment - supple, no bruit auscultated on the right, no bruit auscultated on the left.  Eye Pupil - Bilateral-Regular and Round. Motion - Bilateral-EOMI.  Chest and Lung Exam Auscultation Breath sounds - clear at anterior chest wall and clear at posterior chest wall. Adventitious sounds - No Adventitious sounds.  Cardiovascular Auscultation Rhythm - Regular rate and rhythm. Heart Sounds - S1 WNL and S2 WNL. Murmurs & Other Heart Sounds - Auscultation of the heart reveals - No Murmurs.  Abdomen Palpation/Percussion Tenderness - Abdomen is non-tender to palpation. Rigidity (guarding) - Abdomen is soft. Auscultation Auscultation of the abdomen reveals - Bowel sounds normal.  Female Genitourinary Note: Not done, not pertinent to present illness   Musculoskeletal Note: Examination of the right hip shows flexion to 120 rotation in 30 abduction 40 and external rotation of 40. There is no tenderness over the greater trochanter. There is no pain on provocative testing of the hip. Her LEFT hip can be flexed to 90 with no internal rotation, 20 of external rotation, and 20 of abduction. She has pain on attempted range of motion of the LEFT hip. Her LEFT knee exam is normal. Pulses, sensation, and motor are intact both lower extremities. She has a significantly antalgic gait pattern on the LEFT.  We reviewed AP pelvis and LEFT hip MRI taken previously. She did have significant joint space narrowing and on the MRI did have significant degenerative change. AP pelvis and lateral LEFT hip taken in the office shows that she now has bone-on-bone arthritis of the LEFT hip with subchondral cystic degeneration.  Assessment & Plan Primary osteoarthritis of left hip  (M16.12)  Note:Surgical Plans: Left Total Hip Replacement - Anterior Approach  Disposition:  Home with help  PCP: Dr. Burnett Sheng - pending  IV TXA  Anesthesia Issues: Nausea in the past  Patient was instructed on what medications to stop prior to surgery.  Signed electronically by Joelene Millin, III PA-C

## 2017-06-12 NOTE — Patient Instructions (Addendum)
[name]  06/12/2017   Your procedure is scheduled on: 06-17-17   Report to [facility] Main Entrance Report to Admitting at 6:15 AM.    Call this number if you have problems the morning of surgery [phone]   Remember: ONLY 1 PERSON MAY GO WITH YOU TO SHORT STAY TO GET  READY MORNING OF [place].  Do not eat food or drink liquids :After Midnight.     Take these medicines the morning of surgery with A SIP OF WATER: Amphetamine-Dextroamphetamine (Adderall). You may also bring and use your nasal spray as needed.                                You may not have any metal on your body including hair pins and              piercings  Do not wear jewelry, make-up, lotions, powders or perfumes, deodorant             Do not wear nail polish.  Do not shave  48 hours prior to surgery.                Do not bring valuables to the hospital. [place].  Contacts, dentures or bridgework may not be worn into surgery.  Leave suitcase in the car. After surgery it may be brought to your room.               Please read over the following fact sheets you were given: _____________________________________________________________________             [facility] - Preparing for Surgery Before surgery, you can play an important role.  Because skin is not sterile, your skin needs to be as free of germs as possible.  You can reduce the number of germs on your skin by washing with CHG (chlorahexidine gluconate) soap before surgery.  CHG is an antiseptic cleaner which kills germs and bonds with the skin to continue killing germs even after washing. Please DO NOT use if you have an allergy to CHG or antibacterial soaps.  If your skin becomes reddened/irritated stop using the CHG and inform your nurse when you arrive at Short Stay. Do not shave (including legs and underarms) for at least 48 hours prior to the first CHG shower.   You may shave your face/neck. Please follow these instructions carefully:  1.  Shower with CHG Soap the night before surgery and the  morning of Surgery.  2.  If you choose to wash your hair, wash your hair first as usual with your  normal  shampoo.  3.  After you shampoo, rinse your hair and body thoroughly to remove the  shampoo.                           4.  Use CHG as you would any other liquid soap.  You can apply chg directly  to the skin and wash                       Gently with a scrungie or clean washcloth.  5.  Apply the CHG Soap to your body ONLY  FROM THE NECK DOWN.   Do not use on face/ open                           Wound or open sores. Avoid contact with eyes, ears mouth and genitals (private parts).                       Wash face,  Genitals (private parts) with your normal soap.             6.  Wash thoroughly, paying special attention to the area where your surgery  will be performed.  7.  Thoroughly rinse your body with warm water from the neck down.  8.  DO NOT shower/wash with your normal soap after using and rinsing off  the CHG Soap.                9.  Pat yourself dry with a clean towel.            10.  Wear clean pajamas.            11.  Place clean sheets on your bed the night of your first shower and do not  sleep with pets. Day of Surgery : Do not apply any lotions/deodorants the morning of surgery.  Please wear clean clothes to the hospital/surgery center.  FAILURE TO FOLLOW THESE INSTRUCTIONS MAY RESULT IN THE CANCELLATION OF YOUR SURGERY PATIENT SIGNATURE_________________________________  NURSE SIGNATURE__________________________________  ________________________________________________________________________   Adam Phenix  An incentive spirometer is a tool that can help keep your lungs clear and active. This tool measures how well you are filling your lungs with each breath. Taking long deep breaths may help reverse or decrease the chance of  developing breathing (pulmonary) problems (especially infection) following:  A long period of time when you are unable to move or be active. BEFORE THE PROCEDURE   If the spirometer includes an indicator to show your best effort, your nurse or respiratory therapist will set it to a desired goal.  If possible, sit up straight or lean slightly forward. Try not to slouch.  Hold the incentive spirometer in an upright position. INSTRUCTIONS FOR USE  1. Sit on the edge of your bed if possible, or sit up as far as you can in bed or on a chair. 2. Hold the incentive spirometer in an upright position. 3. Breathe out normally. 4. Place the mouthpiece in your mouth and seal your lips tightly around it. 5. Breathe in slowly and as deeply as possible, raising the piston or the ball toward the top of the column. 6. Hold your breath for 3-5 seconds or for as long as possible. Allow the piston or ball to fall to the bottom of the column. 7. Remove the mouthpiece from your mouth and breathe out normally. 8. Rest for a few seconds and repeat Steps 1 through 7 at least 10 times every 1-2 hours when you are awake. Take your time and take a few normal breaths between deep breaths. 9. The spirometer may include an indicator to show your best effort. Use the indicator as a goal to work toward during each repetition. 10. After each set of 10 deep breaths, practice coughing to be sure your lungs are clear. If you have an incision (the cut made at the time of surgery), support your incision when coughing by placing a pillow or rolled up towels firmly against it. Once you  are able to get out of bed, walk around indoors and cough well. You may stop using the incentive spirometer when instructed by your caregiver.  RISKS AND COMPLICATIONS  Take your time so you do not get dizzy or light-headed.  If you are in pain, you may need to take or ask for pain medication before doing incentive spirometry. It is harder to take a  deep breath if you are having pain. AFTER USE  Rest and breathe slowly and easily.  It can be helpful to keep track of a log of your progress. Your caregiver can provide you with a simple table to help with this. If you are using the spirometer at home, follow these instructions: [place] IF:   You are having difficultly using the spirometer.  You have trouble using the spirometer as often as instructed.  Your pain medication is not giving enough relief while using the spirometer.  You develop fever of 100.5 F (38.1 C) or higher. SEEK IMMEDIATE MEDICAL CARE IF:   You cough up bloody sputum that had not been present before.  You develop fever of 102 F (38.9 C) or greater.  You develop worsening pain at or near the incision site. MAKE SURE YOU:   Understand these instructions.  Will watch your condition.  Will get help right away if you are not doing well or get worse. Document Released: 11/10/2006 Document Revised: 09/22/2011 Document Reviewed: 01/11/2007 Exit[employer] Patient Information 2014 [employer], [place].   ________________________________________________________________________  WHAT IS A BLOOD TRANSFUSION? Blood Transfusion Information  A transfusion is the replacement of blood or some of its parts. Blood is made up of multiple cells which provide different functions.  Red blood cells carry oxygen and are used for blood loss replacement.  White blood cells fight against infection.  Platelets control bleeding.  Plasma helps clot blood.  Other blood products are available for specialized needs, such as hemophilia or other clotting disorders. BEFORE THE TRANSFUSION  Who gives blood for transfusions?   Healthy volunteers who are fully evaluated to make sure their blood is safe. This is blood bank blood. Transfusion therapy is the safest it has ever been in the practice of medicine. Before blood is taken from a donor, a complete history is taken to make  sure that person has no history of diseases nor engages in risky social behavior (examples are intravenous drug use or sexual activity with multiple partners). The donor's travel history is screened to minimize risk of transmitting infections, such as malaria. The donated blood is tested for signs of infectious diseases, such as HIV and hepatitis. The blood is then tested to be sure it is compatible with you in order to minimize the chance of a transfusion reaction. If you or a relative donates blood, this is often done in anticipation of surgery and is not appropriate for emergency situations. It takes many days to process the donated blood. RISKS AND COMPLICATIONS Although transfusion therapy is very safe and saves many lives, the main dangers of transfusion include:   Getting an infectious disease.  Developing a transfusion reaction. This is an allergic reaction to something in the blood you were given. Every precaution is taken to prevent this. The decision to have a blood transfusion has been considered carefully by your caregiver before blood is given. Blood is not given unless the benefits outweigh the risks. AFTER THE TRANSFUSION  Right after receiving a blood transfusion, you will usually feel much better and more energetic. This is  especially true if your red blood cells have gotten low (anemic). The transfusion raises the level of the red blood cells which carry oxygen, and this usually causes an energy increase.  The nurse administering the transfusion will monitor you carefully for complications. HOME CARE INSTRUCTIONS  No special instructions are needed after a transfusion. You may find your energy is better. Speak with your caregiver about any limitations on activity for underlying diseases you may have. SEEK MEDICAL CARE IF:   Your condition is not improving after your transfusion.  You develop redness or irritation at the intravenous (IV) site. SEEK IMMEDIATE MEDICAL CARE IF:   Any of the following symptoms occur over the next 12 hours:  Shaking chills.  You have a temperature by mouth above 102 F (38.9 C), not controlled by medicine.  Chest, back, or muscle pain.  People around you feel you are not acting correctly or are confused.  Shortness of breath or difficulty breathing.  Dizziness and fainting.  You get a rash or develop hives.  You have a decrease in urine output.  Your urine turns a dark color or changes to pink, red, or brown. Any of the following symptoms occur over the next 10 days:  You have a temperature by mouth above 102 F (38.9 C), not controlled by medicine.  Shortness of breath.  Weakness after normal activity.  The white part of the eye turns yellow (jaundice).  You have a decrease in the amount of urine or are urinating less often.  Your urine turns a dark color or changes to pink, red, or brown. Document Released: 06/27/2000 Document Revised: 09/22/2011 Document Reviewed: 02/14/2008 [facility] Patient Information 2014 [place], [place].  _______________________________________________________________________

## 2017-06-15 ENCOUNTER — Encounter (HOSPITAL_COMMUNITY): Payer: Self-pay

## 2017-06-15 ENCOUNTER — Other Ambulatory Visit: Payer: Self-pay

## 2017-06-15 ENCOUNTER — Encounter (HOSPITAL_COMMUNITY)
Admission: RE | Admit: 2017-06-15 | Discharge: 2017-06-15 | Disposition: A | Discharge status: Home / Self Care | Payer: 59 | Source: Ambulatory Visit | Attending: Orthopedic Surgery | Admitting: Orthopedic Surgery

## 2017-06-15 HISTORY — DX: Malignant (primary) neoplasm, unspecified: C80.1

## 2017-06-15 HISTORY — DX: Other specified postprocedural states: Z98.890

## 2017-06-15 HISTORY — DX: Other complications of anesthesia, initial encounter: T88.59XA

## 2017-06-15 HISTORY — DX: Adverse effect of unspecified anesthetic, initial encounter: T41.45XA

## 2017-06-15 HISTORY — DX: Nausea with vomiting, unspecified: R11.2

## 2017-06-15 LAB — COMPREHENSIVE METABOLIC PANEL
ALT: 14 U/L (ref 14–54)
AST: 20 U/L (ref 15–41)
Albumin: 4.3 g/dL (ref 3.5–5.0)
Alkaline Phosphatase: 89 U/L (ref 38–126)
Anion gap: 9 (ref 5–15)
BILIRUBIN TOTAL: 0.7 mg/dL (ref 0.3–1.2)
BUN: 17 mg/dL (ref 6–20)
CHLORIDE: 102 mmol/L (ref 101–111)
CO2: 26 mmol/L (ref 22–32)
CREATININE: 0.58 mg/dL (ref 0.44–1.00)
Calcium: 9.1 mg/dL (ref 8.9–10.3)
Glucose, Bld: 91 mg/dL (ref 65–99)
POTASSIUM: 4 mmol/L (ref 3.5–5.1)
Sodium: 137 mmol/L (ref 135–145)
Total Protein: 6.9 g/dL (ref 6.5–8.1)

## 2017-06-15 LAB — APTT: aPTT: 29 seconds (ref 24–36)

## 2017-06-15 LAB — CBC
HEMATOCRIT: 41.8 % (ref 36.0–46.0)
Hemoglobin: 13.9 g/dL (ref 12.0–15.0)
MCH: 31.4 pg (ref 26.0–34.0)
MCHC: 33.3 g/dL (ref 30.0–36.0)
MCV: 94.4 fL (ref 78.0–100.0)
PLATELETS: 278 10*3/uL (ref 150–400)
RBC: 4.43 MIL/uL (ref 3.87–5.11)
RDW: 12.5 % (ref 11.5–15.5)
WBC: 8.8 10*3/uL (ref 4.0–10.5)

## 2017-06-15 LAB — SURGICAL PCR SCREEN
MRSA, PCR: NEGATIVE
STAPHYLOCOCCUS AUREUS: POSITIVE — AB

## 2017-06-15 LAB — PROTIME-INR
INR: 1.02
PROTHROMBIN TIME: 13.4 s (ref 11.4–15.2)

## 2017-06-15 LAB — ABO/RH: ABO/RH(D): O POS

## 2017-06-16 ENCOUNTER — Encounter (HOSPITAL_COMMUNITY): Payer: Self-pay | Admitting: Anesthesiology

## 2017-06-16 NOTE — Anesthesia Preprocedure Evaluation (Addendum)
Anesthesia Evaluation  Patient identified by MRN, date of birth, ID band Patient awake    Reviewed: Allergy & Precautions, NPO status , Patient's Chart, lab work & pertinent test results  History of Anesthesia Complications (+) PONV and history of anesthetic complications  Airway Mallampati: II  TM Distance: >3 FB Neck ROM: Full    Dental  (+) Teeth Intact, Dental Advisory Given   Pulmonary neg pulmonary ROS, former smoker,    breath sounds clear to auscultation       Cardiovascular negative cardio ROS   Rhythm:Regular Rate:Normal     Neuro/Psych PSYCHIATRIC DISORDERS negative neurological ROS     GI/Hepatic negative GI ROS, Neg liver ROS,   Endo/Other  negative endocrine ROS  Renal/GU negative Renal ROS     Musculoskeletal negative musculoskeletal ROS (+)   Abdominal Normal abdominal exam  (+)   Peds  Hematology negative hematology ROS (+)   Anesthesia Other Findings Day of surgery medications reviewed with the patient.  Reproductive/Obstetrics                            Anesthesia Physical Anesthesia Plan  ASA: II  Anesthesia Plan: Spinal   Post-op Pain Management:    Induction: Intravenous  PONV Risk Score and Plan: 4 or greater and Ondansetron, Dexamethasone, Propofol infusion and Midazolam  Airway Management Planned: Natural Airway and Nasal Cannula  Additional Equipment:   Intra-op Plan:   Post-operative Plan:   Informed Consent: I have reviewed the patients History and Physical, chart, labs and discussed the procedure including the risks, benefits and alternatives for the proposed anesthesia with the patient or authorized representative who has indicated his/her understanding and acceptance.   Dental advisory given  Plan Discussed with: CRNA  Anesthesia Plan Comments:        Anesthesia Quick Evaluation

## 2017-06-17 ENCOUNTER — Other Ambulatory Visit: Payer: Self-pay

## 2017-06-17 ENCOUNTER — Encounter (HOSPITAL_COMMUNITY): Payer: Self-pay | Admitting: *Deleted

## 2017-06-17 ENCOUNTER — Inpatient Hospital Stay (HOSPITAL_COMMUNITY): Payer: 59

## 2017-06-17 ENCOUNTER — Inpatient Hospital Stay (HOSPITAL_COMMUNITY): Payer: 59 | Admitting: Anesthesiology

## 2017-06-17 ENCOUNTER — Inpatient Hospital Stay (HOSPITAL_COMMUNITY)
Admission: RE | Admit: 2017-06-17 | Discharge: 2017-06-18 | DRG: 470 | Disposition: A | Discharge status: Home Health | Payer: 59 | Source: Ambulatory Visit | Attending: Orthopedic Surgery | Admitting: Orthopedic Surgery

## 2017-06-17 ENCOUNTER — Encounter (HOSPITAL_COMMUNITY)
Admission: RE | Disposition: A | Discharge status: Home Health | Payer: Self-pay | Source: Ambulatory Visit | Attending: Orthopedic Surgery

## 2017-06-17 DIAGNOSIS — Z791 Long term (current) use of non-steroidal anti-inflammatories (NSAID): Secondary | ICD-10-CM | POA: Diagnosis not present

## 2017-06-17 DIAGNOSIS — Z87891 Personal history of nicotine dependence: Secondary | ICD-10-CM | POA: Diagnosis not present

## 2017-06-17 DIAGNOSIS — Z96649 Presence of unspecified artificial hip joint: Secondary | ICD-10-CM

## 2017-06-17 DIAGNOSIS — M1612 Unilateral primary osteoarthritis, left hip: Secondary | ICD-10-CM | POA: Diagnosis present

## 2017-06-17 DIAGNOSIS — Z85820 Personal history of malignant melanoma of skin: Secondary | ICD-10-CM

## 2017-06-17 DIAGNOSIS — M169 Osteoarthritis of hip, unspecified: Secondary | ICD-10-CM | POA: Diagnosis present

## 2017-06-17 DIAGNOSIS — Z8262 Family history of osteoporosis: Secondary | ICD-10-CM | POA: Diagnosis not present

## 2017-06-17 DIAGNOSIS — Z90710 Acquired absence of both cervix and uterus: Secondary | ICD-10-CM | POA: Diagnosis not present

## 2017-06-17 DIAGNOSIS — F988 Other specified behavioral and emotional disorders with onset usually occurring in childhood and adolescence: Secondary | ICD-10-CM | POA: Diagnosis present

## 2017-06-17 DIAGNOSIS — Z8261 Family history of arthritis: Secondary | ICD-10-CM

## 2017-06-17 HISTORY — PX: OTHER SURGICAL HISTORY: SHX169

## 2017-06-17 HISTORY — PX: TOTAL HIP ARTHROPLASTY: SHX124

## 2017-06-17 HISTORY — DX: Other specified behavioral and emotional disorders with onset usually occurring in childhood and adolescence: F98.8

## 2017-06-17 LAB — TYPE AND SCREEN
ABO/RH(D): O POS
Antibody Screen: NEGATIVE

## 2017-06-17 SURGERY — ARTHROPLASTY, HIP, TOTAL, ANTERIOR APPROACH
Anesthesia: Spinal | Site: Hip | Laterality: Left

## 2017-06-17 MED ORDER — LIDOCAINE 2% (20 MG/ML) 5 ML SYRINGE
INTRAMUSCULAR | Status: AC
  Filled 2017-06-17: qty 5

## 2017-06-17 MED ORDER — MIDAZOLAM HCL 5 MG/5ML IJ SOLN
INTRAMUSCULAR | Status: DC | PRN
  Administered 2017-06-17 (×2): 1 mg via INTRAVENOUS

## 2017-06-17 MED ORDER — DEXTROSE 5 % IV SOLN
500.0000 mg | Freq: Four times a day (QID) | INTRAVENOUS | Status: DC | PRN
  Administered 2017-06-17: 500 mg via INTRAVENOUS
  Filled 2017-06-17: qty 550

## 2017-06-17 MED ORDER — ONDANSETRON HCL 4 MG/2ML IJ SOLN
4.0000 mg | Freq: Four times a day (QID) | INTRAMUSCULAR | Status: DC | PRN
  Administered 2017-06-17: 4 mg via INTRAVENOUS
  Filled 2017-06-17: qty 2

## 2017-06-17 MED ORDER — HYDROMORPHONE HCL 1 MG/ML IJ SOLN
INTRAMUSCULAR | Status: AC
  Filled 2017-06-17: qty 2

## 2017-06-17 MED ORDER — DOCUSATE SODIUM 100 MG PO CAPS
100.0000 mg | ORAL_CAPSULE | Freq: Two times a day (BID) | ORAL | Status: DC
  Administered 2017-06-17 – 2017-06-18 (×2): 100 mg via ORAL
  Filled 2017-06-17 (×2): qty 1

## 2017-06-17 MED ORDER — FLEET ENEMA 7-19 GM/118ML RE ENEM: 1.0000 | ENEMA | Freq: Once | RECTAL | Status: DC | PRN

## 2017-06-17 MED ORDER — MENTHOL 3 MG MT LOZG: 1.0000 | LOZENGE | OROMUCOSAL | Status: DC | PRN

## 2017-06-17 MED ORDER — SODIUM CHLORIDE 0.9 % IR SOLN
Status: DC | PRN
  Administered 2017-06-17: 1000 mL

## 2017-06-17 MED ORDER — TRAMADOL HCL 50 MG PO TABS
50.0000 mg | ORAL_TABLET | Freq: Four times a day (QID) | ORAL | Status: DC | PRN
  Administered 2017-06-17 (×2): 50 mg via ORAL
  Filled 2017-06-17 (×2): qty 1

## 2017-06-17 MED ORDER — SODIUM CHLORIDE 0.9 % IV SOLN
1000.0000 mg | Freq: Once | INTRAVENOUS | Status: AC
  Administered 2017-06-17: 14:00:00 1000 mg via INTRAVENOUS
  Filled 2017-06-17: qty 1100

## 2017-06-17 MED ORDER — MORPHINE SULFATE (PF) 4 MG/ML IV SOLN: 1.0000 mg | INTRAVENOUS | Status: DC | PRN

## 2017-06-17 MED ORDER — ACETAMINOPHEN 10 MG/ML IV SOLN
1000.0000 mg | Freq: Once | INTRAVENOUS | Status: AC
  Administered 2017-06-17: 1000 mg via INTRAVENOUS

## 2017-06-17 MED ORDER — ACETAMINOPHEN 500 MG PO TABS
1000.0000 mg | ORAL_TABLET | Freq: Four times a day (QID) | ORAL | Status: DC
  Administered 2017-06-17 – 2017-06-18 (×3): 1000 mg via ORAL
  Filled 2017-06-17 (×3): qty 2

## 2017-06-17 MED ORDER — ACETAMINOPHEN 10 MG/ML IV SOLN
INTRAVENOUS | Status: AC
  Filled 2017-06-17: qty 100

## 2017-06-17 MED ORDER — PROPOFOL 10 MG/ML IV BOLUS
INTRAVENOUS | Status: AC
  Filled 2017-06-17: qty 60

## 2017-06-17 MED ORDER — DIPHENHYDRAMINE HCL 12.5 MG/5ML PO ELIX: 12.5000 mg | ORAL_SOLUTION | ORAL | Status: DC | PRN

## 2017-06-17 MED ORDER — ONDANSETRON HCL 4 MG/2ML IJ SOLN
INTRAMUSCULAR | Status: DC | PRN
  Administered 2017-06-17: 4 mg via INTRAVENOUS

## 2017-06-17 MED ORDER — OXYCODONE HCL 5 MG PO TABS: 10.0000 mg | ORAL_TABLET | ORAL | Status: DC | PRN

## 2017-06-17 MED ORDER — CEFAZOLIN SODIUM-DEXTROSE 2-4 GM/100ML-% IV SOLN
INTRAVENOUS | Status: AC
  Filled 2017-06-17: qty 100

## 2017-06-17 MED ORDER — POLYETHYLENE GLYCOL 3350 17 G PO PACK: 17.0000 g | PACK | Freq: Every day | ORAL | Status: DC | PRN

## 2017-06-17 MED ORDER — PHENOL 1.4 % MT LIQD: 1.0000 | OROMUCOSAL | Status: DC | PRN

## 2017-06-17 MED ORDER — DEXAMETHASONE SODIUM PHOSPHATE 10 MG/ML IJ SOLN
10.0000 mg | Freq: Once | INTRAMUSCULAR | Status: AC
  Administered 2017-06-17: 10 mg via INTRAVENOUS

## 2017-06-17 MED ORDER — CEFAZOLIN SODIUM-DEXTROSE 2-4 GM/100ML-% IV SOLN
2.0000 g | INTRAVENOUS | Status: AC
  Administered 2017-06-17: 2 g via INTRAVENOUS

## 2017-06-17 MED ORDER — PROMETHAZINE HCL 25 MG/ML IJ SOLN: 6.2500 mg | INTRAMUSCULAR | Status: DC | PRN

## 2017-06-17 MED ORDER — BUPIVACAINE HCL (PF) 0.25 % IJ SOLN
INTRAMUSCULAR | Status: AC
  Filled 2017-06-17: qty 30

## 2017-06-17 MED ORDER — ACETAMINOPHEN 325 MG PO TABS: 650.0000 mg | ORAL_TABLET | ORAL | Status: DC | PRN

## 2017-06-17 MED ORDER — OXYCODONE HCL 5 MG PO TABS
5.0000 mg | ORAL_TABLET | ORAL | Status: DC | PRN
Start: 2017-06-17 — End: 2017-06-18
  Administered 2017-06-17: 5 mg via ORAL
  Filled 2017-06-17: qty 1

## 2017-06-17 MED ORDER — LACTATED RINGERS IV SOLN: INTRAVENOUS | Status: DC

## 2017-06-17 MED ORDER — ONDANSETRON HCL 4 MG/2ML IJ SOLN
INTRAMUSCULAR | Status: AC
  Filled 2017-06-17: qty 2

## 2017-06-17 MED ORDER — BISACODYL 10 MG RE SUPP: 10.0000 mg | Freq: Every day | RECTAL | Status: DC | PRN

## 2017-06-17 MED ORDER — AMPHETAMINE-DEXTROAMPHETAMINE 20 MG PO TABS
20.0000 mg | ORAL_TABLET | Freq: Every morning | ORAL | Status: DC
  Administered 2017-06-18: 20 mg via ORAL
  Filled 2017-06-17: qty 1

## 2017-06-17 MED ORDER — LACTATED RINGERS IV SOLN
INTRAVENOUS | Status: DC
Start: 2017-06-17 — End: 2017-06-17
  Administered 2017-06-17 (×2): via INTRAVENOUS

## 2017-06-17 MED ORDER — FLUTICASONE PROPIONATE 50 MCG/ACT NA SUSP: 2.0000 | Freq: Every day | NASAL | Status: DC | PRN

## 2017-06-17 MED ORDER — METOCLOPRAMIDE HCL 5 MG/ML IJ SOLN
5.0000 mg | Freq: Three times a day (TID) | INTRAMUSCULAR | Status: DC | PRN
  Administered 2017-06-17: 10 mg via INTRAVENOUS
  Filled 2017-06-17: qty 2

## 2017-06-17 MED ORDER — DEXAMETHASONE SODIUM PHOSPHATE 10 MG/ML IJ SOLN
10.0000 mg | Freq: Once | INTRAMUSCULAR | Status: AC
  Administered 2017-06-18: 08:00:00 10 mg via INTRAVENOUS
  Filled 2017-06-17: qty 1

## 2017-06-17 MED ORDER — FENTANYL CITRATE (PF) 100 MCG/2ML IJ SOLN
INTRAMUSCULAR | Status: DC | PRN
  Administered 2017-06-17: 100 ug via INTRAVENOUS

## 2017-06-17 MED ORDER — HYDROMORPHONE HCL 1 MG/ML IJ SOLN
0.2500 mg | INTRAMUSCULAR | Status: DC | PRN
  Administered 2017-06-17 (×4): 0.5 mg via INTRAVENOUS

## 2017-06-17 MED ORDER — ACETAMINOPHEN 650 MG RE SUPP: 650.0000 mg | RECTAL | Status: DC | PRN

## 2017-06-17 MED ORDER — SODIUM CHLORIDE 0.9 % IV SOLN
1000.0000 mg | INTRAVENOUS | Status: AC
  Administered 2017-06-17: 1000 mg via INTRAVENOUS
  Filled 2017-06-17: qty 1100

## 2017-06-17 MED ORDER — BUPIVACAINE IN DEXTROSE 0.75-8.25 % IT SOLN
INTRATHECAL | Status: DC | PRN
  Administered 2017-06-17: 1.6 mL via INTRATHECAL

## 2017-06-17 MED ORDER — STERILE WATER FOR IRRIGATION IR SOLN
Status: DC | PRN
  Administered 2017-06-17: 2000 mL

## 2017-06-17 MED ORDER — PROPOFOL 10 MG/ML IV BOLUS
INTRAVENOUS | Status: DC | PRN
  Administered 2017-06-17 (×3): 20 mg via INTRAVENOUS

## 2017-06-17 MED ORDER — PROPOFOL 500 MG/50ML IV EMUL
INTRAVENOUS | Status: DC | PRN
  Administered 2017-06-17: 20 ug/kg/min via INTRAVENOUS

## 2017-06-17 MED ORDER — MEPERIDINE HCL 50 MG/ML IJ SOLN: 6.2500 mg | INTRAMUSCULAR | Status: DC | PRN

## 2017-06-17 MED ORDER — RIVAROXABAN 10 MG PO TABS
10.0000 mg | ORAL_TABLET | Freq: Every day | ORAL | Status: DC
  Administered 2017-06-18: 08:00:00 10 mg via ORAL
  Filled 2017-06-17: qty 1

## 2017-06-17 MED ORDER — MIDAZOLAM HCL 2 MG/2ML IJ SOLN
INTRAMUSCULAR | Status: AC
  Filled 2017-06-17: qty 2

## 2017-06-17 MED ORDER — METOCLOPRAMIDE HCL 5 MG PO TABS: 5.0000 mg | ORAL_TABLET | Freq: Three times a day (TID) | ORAL | Status: DC | PRN

## 2017-06-17 MED ORDER — ONDANSETRON HCL 4 MG PO TABS: 4.0000 mg | ORAL_TABLET | Freq: Four times a day (QID) | ORAL | Status: DC | PRN

## 2017-06-17 MED ORDER — CEFAZOLIN SODIUM-DEXTROSE 1-4 GM/50ML-% IV SOLN
1.0000 g | Freq: Four times a day (QID) | INTRAVENOUS | Status: AC
  Administered 2017-06-17 (×2): 1 g via INTRAVENOUS
  Filled 2017-06-17 (×2): qty 50

## 2017-06-17 MED ORDER — CHLORHEXIDINE GLUCONATE 4 % EX LIQD: 60.0000 mL | Freq: Once | CUTANEOUS | Status: DC

## 2017-06-17 MED ORDER — FENTANYL CITRATE (PF) 100 MCG/2ML IJ SOLN
INTRAMUSCULAR | Status: AC
  Filled 2017-06-17: qty 2

## 2017-06-17 MED ORDER — DEXAMETHASONE SODIUM PHOSPHATE 10 MG/ML IJ SOLN
INTRAMUSCULAR | Status: AC
  Filled 2017-06-17: qty 1

## 2017-06-17 MED ORDER — BUPIVACAINE HCL (PF) 0.25 % IJ SOLN
INTRAMUSCULAR | Status: DC | PRN
  Administered 2017-06-17: 30 mL

## 2017-06-17 MED ORDER — SODIUM CHLORIDE 0.9 % IV SOLN
INTRAVENOUS | Status: DC
  Administered 2017-06-17 – 2017-06-18 (×2): via INTRAVENOUS

## 2017-06-17 MED ORDER — METHOCARBAMOL 500 MG PO TABS
500.0000 mg | ORAL_TABLET | Freq: Four times a day (QID) | ORAL | Status: DC | PRN
  Administered 2017-06-17 – 2017-06-18 (×2): 500 mg via ORAL
  Filled 2017-06-17 (×3): qty 1

## 2017-06-17 SURGICAL SUPPLY — 37 items
BAG DECANTER FOR FLEXI CONT (MISCELLANEOUS) ×3 IMPLANT
BAG SPEC THK2 15X12 ZIP CLS (MISCELLANEOUS)
BAG ZIPLOCK 12X15 (MISCELLANEOUS) IMPLANT
BLADE SAG 18X100X1.27 (BLADE) ×3 IMPLANT
CAPT HIP TOTAL 2 ×2 IMPLANT
CLOSURE WOUND 1/2 X4 (GAUZE/BANDAGES/DRESSINGS) ×1
CLOTH BEACON ORANGE TIMEOUT ST (SAFETY) ×3 IMPLANT
COVER PERINEAL POST (MISCELLANEOUS) ×3 IMPLANT
COVER SURGICAL LIGHT HANDLE (MISCELLANEOUS) ×3 IMPLANT
DECANTER SPIKE VIAL GLASS SM (MISCELLANEOUS) ×3 IMPLANT
DRAPE STERI IOBAN 125X83 (DRAPES) ×3 IMPLANT
DRAPE U-SHAPE 47X51 STRL (DRAPES) ×6 IMPLANT
DRSG ADAPTIC 3X8 NADH LF (GAUZE/BANDAGES/DRESSINGS) ×3 IMPLANT
DRSG MEPILEX BORDER 4X4 (GAUZE/BANDAGES/DRESSINGS) ×3 IMPLANT
DRSG MEPILEX BORDER 4X8 (GAUZE/BANDAGES/DRESSINGS) ×3 IMPLANT
DURAPREP 26ML APPLICATOR (WOUND CARE) ×3 IMPLANT
ELECT REM PT RETURN 15FT ADLT (MISCELLANEOUS) ×3 IMPLANT
EVACUATOR 1/8 PVC DRAIN (DRAIN) ×3 IMPLANT
GLOVE BIO SURGEON STRL SZ7.5 (GLOVE) ×3 IMPLANT
GLOVE BIO SURGEON STRL SZ8 (GLOVE) ×6 IMPLANT
GLOVE BIOGEL PI IND STRL 7.0 (GLOVE) IMPLANT
GLOVE BIOGEL PI IND STRL 8 (GLOVE) ×2 IMPLANT
GLOVE BIOGEL PI INDICATOR 7.0 (GLOVE) ×8
GLOVE BIOGEL PI INDICATOR 8 (GLOVE) ×4
GOWN STRL REUS W/TWL LRG LVL3 (GOWN DISPOSABLE) ×5 IMPLANT
GOWN STRL REUS W/TWL XL LVL3 (GOWN DISPOSABLE) ×5 IMPLANT
PACK ANTERIOR HIP CUSTOM (KITS) ×3 IMPLANT
STRIP CLOSURE SKIN 1/2X4 (GAUZE/BANDAGES/DRESSINGS) ×2 IMPLANT
SUT ETHIBOND NAB CT1 #1 30IN (SUTURE) ×3 IMPLANT
SUT MNCRL AB 4-0 PS2 18 (SUTURE) ×3 IMPLANT
SUT STRATAFIX 0 PDS 27 VIOLET (SUTURE) ×3
SUT VIC AB 2-0 CT1 27 (SUTURE) ×6
SUT VIC AB 2-0 CT1 TAPERPNT 27 (SUTURE) ×2 IMPLANT
SUTURE STRATFX 0 PDS 27 VIOLET (SUTURE) ×1 IMPLANT
SYR 50ML LL SCALE MARK (SYRINGE) IMPLANT
TRAY FOLEY BAG SILVER LF 14FR (CATHETERS) ×2 IMPLANT
YANKAUER SUCT BULB TIP 10FT TU (MISCELLANEOUS) ×3 IMPLANT

## 2017-06-17 NOTE — Anesthesia Procedure Notes (Signed)
Date/Time: 06/17/2017 8:21 AM Performed by: Sharlette Dense, CRNA Oxygen Delivery Method: Simple face mask

## 2017-06-17 NOTE — Discharge Instructions (Addendum)
° °Dr. Frank [name] °Total Joint Specialist °[facility] °[address] °[place], [place] [address] °[phone] ° °ANTERIOR APPROACH TOTAL HIP REPLACEMENT POSTOPERATIVE DIRECTIONS ° ° °Hip Rehabilitation, Guidelines Following Surgery  °The results of a hip operation are greatly improved after range of motion and muscle strengthening exercises. Follow all safety measures which are given to protect your hip. If any of these exercises cause increased pain or swelling in your joint, decrease the amount until you are comfortable again. Then slowly increase the exercises. Call your caregiver if you have problems or questions.  ° °HOME CARE INSTRUCTIONS  °Remove items at home which could result in a fall. This includes throw rugs or furniture in walking pathways.  °· ICE to the affected hip every three hours for 30 minutes at a time and then as needed for pain and swelling.  Continue to use ice on the hip for pain and swelling from surgery. You may notice swelling that will progress down to the foot and ankle.  This is normal after surgery.  Elevate the leg when you are not up walking on it.   °· Continue to use the breathing machine which will help keep your temperature down.  It is common for your temperature to cycle up and down following surgery, especially at night when you are not up moving around and exerting yourself.  The breathing machine keeps your lungs expanded and your temperature down. ° ° °DIET °You may resume your previous home diet once your are discharged from the hospital. ° °DRESSING / WOUND CARE / SHOWERING °You may shower 3 days after surgery, but keep the wounds dry during showering.  You may use an occlusive plastic wrap (Press'n Seal for example), NO SOAKING/SUBMERGING IN THE BATHTUB.  If the bandage gets wet, change with a clean dry gauze.  If the incision gets wet, pat the wound dry with a clean towel. °You may start showering once you are discharged home but do not  submerge the incision under water. Just pat the incision dry and apply a dry gauze dressing on daily. °Change the surgical dressing daily and reapply a dry dressing each time. ° °ACTIVITY °Walk with your walker as instructed. °Use walker as long as suggested by your caregivers. °Avoid periods of inactivity such as sitting longer than an hour when not asleep. This helps prevent blood clots.  °You may resume a sexual relationship in one month or when given the OK by your doctor.  °You may return to work once you are cleared by your doctor.  °Do not drive a car for 6 weeks or until released by you surgeon.  °Do not drive while taking narcotics. ° °WEIGHT BEARING °Weight bearing as tolerated with assist device (walker, cane, etc) as directed, use it as long as suggested by your surgeon or therapist, typically at least 4-6 weeks. ° °POSTOPERATIVE CONSTIPATION PROTOCOL °Constipation - defined medically as fewer than three stools per week and severe constipation as less than one stool per week. ° °One of the most common issues patients have following surgery is constipation.  Even if you have a regular bowel pattern at home, your normal regimen is likely to be disrupted due to multiple reasons following surgery.  Combination of anesthesia, postoperative narcotics, change in appetite and fluid intake all can affect your bowels.  In order to avoid complications following surgery, here are some recommendations in order to help you during your recovery period. ° °Colace (docusate) - Pick up an over-the-counter   form of Colace or another stool softener and take twice a day as long as you are requiring postoperative pain medications.  Take with a full glass of water daily.  If you experience loose stools or diarrhea, hold the colace until you stool forms back up.  If your symptoms do not get better within 1 week or if they get worse, check with your doctor. ° °Dulcolax (bisacodyl) - Pick up over-the-counter and take as directed  by the product packaging as needed to assist with the movement of your bowels.  Take with a full glass of water.  Use this product as needed if not relieved by Colace only.  ° °MiraLax (polyethylene glycol) - Pick up over-the-counter to have on hand.  MiraLax is a solution that will increase the amount of water in your bowels to assist with bowel movements.  Take as directed and can mix with a glass of water, juice, soda, coffee, or tea.  Take if you go more than two days without a movement. °Do not use MiraLax more than once per day. Call your doctor if you are still constipated or irregular after using this medication for 7 days in a row. ° °If you continue to have problems with postoperative constipation, please contact the office for further assistance and recommendations.  If you experience "the worst abdominal pain ever" or develop nausea or vomiting, please contact the office immediatly for further recommendations for treatment. ° °ITCHING ° If you experience itching with your medications, try taking only a single pain pill, or even half a pain pill at a time.  You can also use Benadryl over the counter for itching or also to help with sleep.  ° °TED HOSE STOCKINGS °Wear the elastic stockings on both legs for three weeks following surgery during the day but you may remove then at night for sleeping. ° °MEDICATIONS °See your medication summary on the “After Visit Summary” that the nursing staff will review with you prior to discharge.  You may have some home medications which will be placed on hold until you complete the course of blood thinner medication.  It is important for you to complete the blood thinner medication as prescribed by your surgeon.  Continue your approved medications as instructed at time of discharge. ° °PRECAUTIONS °If you experience chest pain or shortness of breath - call 911 immediately for transfer to the hospital emergency department.  °If you develop a fever greater that 101 F,  purulent drainage from wound, increased redness or drainage from wound, foul odor from the wound/dressing, or calf pain - CONTACT YOUR SURGEON.   °                                                °FOLLOW-UP APPOINTMENTS °Make sure you keep all of your appointments after your operation with your surgeon and caregivers. You should call the office at the above phone number and make an appointment for approximately two weeks after the date of your surgery or on the date instructed by your surgeon outlined in the "After Visit Summary". ° °RANGE OF MOTION AND STRENGTHENING EXERCISES  °These exercises are designed to help you keep full movement of your hip joint. Follow your caregiver's or physical therapist's instructions. Perform all exercises about fifteen times, three times per day or as directed. Exercise both hips, even if you   have had only one joint replacement. These exercises can be done on a training (exercise) mat, on the floor, on a table or on a bed. Use whatever works the best and is most comfortable for you. Use music or television while you are exercising so that the exercises are a pleasant break in your day. This will make your life better with the exercises acting as a break in routine you can look forward to.  °Lying on your back, slowly slide your foot toward your buttocks, raising your knee up off the floor. Then slowly slide your foot back down until your leg is straight again.  °Lying on your back spread your legs as far apart as you can without causing discomfort.  °Lying on your side, raise your upper leg and foot straight up from the floor as far as is comfortable. Slowly lower the leg and repeat.  °Lying on your back, tighten up the muscle in the front of your thigh (quadriceps muscles). You can do this by keeping your leg straight and trying to raise your heel off the floor. This helps strengthen the largest muscle supporting your knee.  °Lying on your back, tighten up the muscles of your  buttocks both with the legs straight and with the knee bent at a comfortable angle while keeping your heel on the floor.  ° °IF YOU ARE TRANSFERRED TO A SKILLED REHAB FACILITY °If the patient is transferred to a skilled rehab facility following release from the hospital, a list of the current medications will be sent to the facility for the patient to continue.  When discharged from the skilled rehab facility, please have the facility set up the patient's Home Health Physical Therapy prior to being released. Also, the skilled facility will be responsible for providing the patient with their medications at time of release from the facility to include their pain medication, the muscle relaxants, and their blood thinner medication. If the patient is still at the rehab facility at time of the two week follow up appointment, the skilled rehab facility will also need to assist the patient in arranging follow up appointment in our office and any transportation needs. ° °MAKE SURE YOU:  °Understand these instructions.  °Get help right away if you are not doing well or get worse.  ° ° °Pick up stool softner and laxative for home use following surgery while on pain medications. °Do not submerge incision under water. °Please use good hand washing techniques while changing dressing each day. °May shower starting three days after surgery. °Please use a clean towel to pat the incision dry following showers. °Continue to use ice for pain and swelling after surgery. °Do not use any lotions or creams on the incision until instructed by your surgeon. ° °Take Xarelto for two and a half more weeks following discharge from the hospital, then discontinue Xarelto. °Once the patient has completed the blood thinner regimen, then take a Baby 81 mg Aspirin daily for three more weeks. ° ° ° ° °Information on my medicine - XARELTO® (Rivaroxaban) ° °Why was Xarelto® prescribed for you? °Xarelto® was prescribed for you to reduce the risk of blood  clots forming after orthopedic surgery. The medical term for these abnormal blood clots is venous thromboembolism (VTE). ° °What do you need to know about xarelto® ? °Take your Xarelto® ONCE DAILY at the same time every day. °You may take it either with or without food. ° °If you have difficulty swallowing the tablet whole, you may   crush it and mix in applesauce just prior to taking your dose. ° °Take Xarelto® exactly as prescribed by your doctor and DO NOT stop taking Xarelto® without talking to the doctor who prescribed the medication.  Stopping without other VTE prevention medication to take the place of Xarelto® may increase your risk of developing a clot. ° °After discharge, you should have regular check-up appointments with your healthcare provider that is prescribing your Xarelto®.   ° °What do you do if you miss a dose? °If you miss a dose, take it as soon as you remember on the same day then continue your regularly scheduled once daily regimen the next day. Do not take two doses of Xarelto® on the same day.  ° °Important Safety Information °A possible side effect of Xarelto® is bleeding. You should call your healthcare provider right away if you experience any of the following: °? Bleeding from an injury or your nose that does not stop. °? Unusual colored urine (red or dark brown) or unusual colored stools (red or black). °? Unusual bruising for unknown reasons. °? A serious fall or if you hit your head (even if there is no bleeding). ° °Some medicines may interact with Xarelto® and might increase your risk of bleeding while on Xarelto®. To help avoid this, consult your healthcare provider or pharmacist prior to using any new prescription or non-prescription medications, including herbals, vitamins, non-steroidal anti-inflammatory drugs (NSAIDs) and supplements. ° °This website has more information on Xarelto®: www.xarelto.com. ° ° ° °

## 2017-06-17 NOTE — Evaluation (Signed)
Physical Therapy Evaluation Patient Details Name: [name] MRN: [id number] DOB: February 02, 1956 Today's Date: 06/17/2017   History of Present Illness  Pt s/p L THR   Clinical Impression  Pt s/p L THR and presents with decreased L LE strength/ROM and post op pain and nausea limiting functional mobility.  Pt should progress to dc home with family assist.    Follow Up Recommendations DC plan and follow up therapy as arranged by surgeon    Equipment Recommendations  Rolling walker with 5" wheels    Recommendations for Other Services       Precautions / Restrictions Precautions Precautions: Fall Restrictions Weight Bearing Restrictions: No Other Position/Activity Restrictions: WBAT      Mobility  Bed Mobility Overal bed mobility: Needs Assistance Bed Mobility: Supine to Sit     Supine to sit: Min assist     General bed mobility comments: cues for sequence and use of R LE to self assist  Transfers Overall transfer level: Needs assistance Equipment used: Rolling walker (2 wheeled) Transfers: Sit to/from Stand Sit to Stand: Min assist         General transfer comment: cues for LE management and use of UEs to self assist  Ambulation/Gait Ambulation/Gait assistance: Min assist Ambulation Distance (Feet): 6 Feet Assistive device: Rolling walker (2 wheeled) Gait Pattern/deviations: Step-to pattern;Decreased step length - right;Decreased step length - left;Shuffle;Trunk flexed Gait velocity: decr Gait velocity interpretation: Below normal speed for age/gender General Gait Details: cues for sequence, posture and position from [employer]            Wheelchair Mobility    Modified Rankin (Stroke Patients Only)       Balance                                             Pertinent Vitals/Pain Pain Assessment: 0-10 Pain Score: 3  Pain Location: L hip Pain Descriptors / Indicators: Aching;Sore Pain Intervention(s): Limited activity within  patient's tolerance;Monitored during session;Premedicated before session;Ice applied    Home Living Family/patient expects to be discharged to:: Private residence Living Arrangements: Alone Available Help at Discharge: Family Type of Home: House Home Access: Stairs to enter Entrance Stairs-Rails: Psychiatric nurse of Steps: 4 Home Layout: Able to live on main level with bedroom/bathroom Home Equipment: None Additional Comments: Dtr from [place] is here to assist    Prior Function Level of Independence: Independent               Hand Dominance   Dominant Hand: Right    Extremity/Trunk Assessment   Upper Extremity Assessment Upper Extremity Assessment: Overall WFL for tasks assessed    Lower Extremity Assessment Lower Extremity Assessment: LLE deficits/detail    Cervical / Trunk Assessment Cervical / Trunk Assessment: Normal  Communication   Communication: No difficulties  Cognition Arousal/Alertness: Awake/alert Behavior During Therapy: WFL for tasks assessed/performed Overall Cognitive Status: Within Functional Limits for tasks assessed                                        General Comments      Exercises Total Joint Exercises Ankle Circles/Pumps: AROM;Both;15 reps;Supine   Assessment/Plan    PT Assessment Patient needs continued PT services  PT Problem List Decreased strength;Decreased range of motion;Decreased activity  tolerance;Decreased mobility;Decreased knowledge of use of DME;Pain       PT Treatment Interventions DME instruction;Gait training;Stair training;Functional mobility training;Therapeutic activities;Therapeutic exercise;Patient/family education    PT Goals (Current goals can be found in the Care Plan section)  Acute Rehab PT Goals Patient Stated Goal: Regain IND PT Goal Formulation: With patient Time For Goal Achievement: 06/20/17 Potential to Achieve Goals: Good    Frequency 7X/week   Barriers to  discharge        Co-evaluation               AM-PAC PT "6 Clicks" Daily Activity  Outcome Measure Difficulty turning over in bed (including adjusting bedclothes, sheets and blankets)?: Unable Difficulty moving from lying on back to sitting on the side of the bed? : Unable Difficulty sitting down on and standing up from a chair with arms (e.g., wheelchair, bedside commode, etc,.)?: Unable Help needed moving to and from a bed to chair (including a wheelchair)?: A Little Help needed walking in hospital room?: A Little Help needed climbing 3-5 steps with a railing? : A Little 6 Click Score: 12    End of Session Equipment Utilized During Treatment: Gait belt Activity Tolerance: Patient tolerated treatment well Patient left: in chair;with call bell/phone within reach;with chair alarm set Nurse Communication: Mobility status PT Visit Diagnosis: Unsteadiness on feet (R26.81);Difficulty in walking, not elsewhere classified (R26.2)    Time: 5361-4431 PT Time Calculation (min) (ACUTE ONLY): 27 min   Charges:   PT Evaluation $PT Eval Low Complexity: 1 Low PT Treatments $Gait Training: 8-22 mins   PT G Codes:        Pg [phone]   Byron Tipping 06/17/2017, 4:40 PM

## 2017-06-17 NOTE — Anesthesia Procedure Notes (Signed)
Spinal  Patient location during procedure: OR Start time: 06/17/2017 8:21 AM End time: 06/17/2017 8:26 AM Staffing Resident/CRNA: Sharlette Dense, CRNA Performed: resident/CRNA  Preanesthetic Checklist Completed: patient identified, site marked, surgical consent, pre-op evaluation, timeout performed, IV checked, risks and benefits discussed and monitors and equipment checked Spinal Block Patient position: sitting Prep: Betadine Patient monitoring: heart rate, continuous pulse ox and blood pressure Approach: midline Location: L4-5 Injection technique: single-shot Needle Needle type: Sprotte  Needle gauge: 24 G Needle length: 9 cm Additional Notes Kit expiration date 12/12/2018 and lot #[id number] Clear free flow CSF, negative heme, negative paresthesia Tolerated well and returned to supine position

## 2017-06-17 NOTE — Transfer of Care (Signed)
Immediate Anesthesia Transfer of Care Note  Patient: [name]  Procedure(s) Performed: LEFT TOTAL HIP ARTHROPLASTY ANTERIOR APPROACH (Left Hip)  Patient Location: PACU  Anesthesia Type:Spinal  Level of Consciousness: awake, alert  and oriented  Airway & Oxygen Therapy: Patient Spontanous Breathing and Patient connected to face mask oxygen  Post-op Assessment: Report given to RN and Post -op Vital signs reviewed and stable  Post vital signs: Reviewed and stable  Last Vitals:  Vitals:   06/17/17 0639  BP: (!) 146/93  Pulse: 86  Resp: 16  Temp: (!) 36.4 C  SpO2: 100%    Last Pain:  Vitals:   06/17/17 0639  TempSrc: Oral  PainSc: 0-No pain         Complications: No apparent anesthesia complications

## 2017-06-17 NOTE — Anesthesia Postprocedure Evaluation (Signed)
Anesthesia Post Note  Patient: [name]  Procedure(s) Performed: LEFT TOTAL HIP ARTHROPLASTY ANTERIOR APPROACH (Left Hip)     Patient location during evaluation: PACU Anesthesia Type: Spinal Level of consciousness: oriented and awake and alert Pain management: pain level controlled Vital Signs Assessment: post-procedure vital signs reviewed and stable Respiratory status: spontaneous breathing, respiratory function stable and patient connected to nasal cannula oxygen Cardiovascular status: blood pressure returned to baseline and stable Postop Assessment: no headache, no backache, no apparent nausea or vomiting, spinal receding and patient able to bend at knees Anesthetic complications: no    Last Vitals:  Vitals:   06/17/17 1452 06/17/17 1757  BP: 122/77 113/69  Pulse: 80 77  Resp: 16 19  Temp: 36.7 C 36.7 C  SpO2: 100% 98%    Last Pain:  Vitals:   06/17/17 1806  TempSrc:   PainSc: [place] Toyna Erisman

## 2017-06-17 NOTE — Op Note (Signed)
OPERATIVE REPORT- TOTAL HIP ARTHROPLASTY   PREOPERATIVE DIAGNOSIS: Osteoarthritis of the Left hip.   POSTOPERATIVE DIAGNOSIS: Osteoarthritis of the Left  hip.   PROCEDURE: Left total hip arthroplasty, anterior approach.   SURGEON: Gaynelle Arabian, MD   ASSISTANT: Arlee Muslim, PA-C  ANESTHESIA:  Spinal  ESTIMATED BLOOD LOSS:-350 mL    DRAINS: Hemovac x1.   COMPLICATIONS: None   CONDITION: PACU - hemodynamically stable.   BRIEF CLINICAL NOTE: [name] is a 61 y.o. female who has advanced end-  stage arthritis of their Left  hip with progressively worsening pain and  dysfunction.The patient has failed nonoperative management and presents for  total hip arthroplasty.   PROCEDURE IN DETAIL: After successful administration of spinal  anesthetic, the traction boots for the [facility] bed were placed on both  feet and the patient was placed onto the [facility] bed, boots placed into the leg  holders. The Left hip was then isolated from the perineum with plastic  drapes and prepped and draped in the usual sterile fashion. ASIS and  greater trochanter were marked and a oblique incision was made, starting  at about 1 cm lateral and 2 cm distal to the ASIS and coursing towards  the anterior cortex of the femur. The skin was cut with a 10 blade  through subcutaneous tissue to the level of the fascia overlying the  tensor fascia lata muscle. The fascia was then incised in line with the  incision at the junction of the anterior third and posterior 2/3rd. The  muscle was teased off the fascia and then the interval between the TFL  and the rectus was developed. The Hohmann retractor was then placed at  the top of the femoral neck over the capsule. The vessels overlying the  capsule were cauterized and the fat on top of the capsule was removed.  A Hohmann retractor was then placed anterior underneath the rectus  femoris to give exposure to the entire anterior capsule. A T-shaped   capsulotomy was performed. The edges were tagged and the femoral head  was identified.       Osteophytes are removed off the superior acetabulum.  The femoral neck was then cut in situ with an oscillating saw. Traction  was then applied to the left lower extremity utilizing the [facility]  traction. The femoral head was then removed. Retractors were placed  around the acetabulum and then circumferential removal of the labrum was  performed. Osteophytes were also removed. Reaming starts at 45 mm to  medialize and  Increased in 2 mm increments to 47 mm. We reamed in  approximately 40 degrees of abduction, 20 degrees anteversion. A 48 mm  pinnacle acetabular shell was then impacted in anatomic position under  fluoroscopic guidance with excellent purchase. We did not need to place  any additional dome screws. A 28 mm neutral + 4 marathon liner was then  placed into the acetabular shell.       The femoral lift was then placed along the lateral aspect of the femur  just distal to the vastus ridge. The leg was  externally rotated and capsule  was stripped off the inferior aspect of the femoral neck down to the  level of the lesser trochanter, this was done with electrocautery. The femur was lifted after this was performed. The  leg was then placed in an extended and adducted position essentially delivering the femur. We also removed the capsule superiorly and the piriformis from the piriformis  fossa to gain excellent exposure of the  proximal femur. Rongeur was used to remove some cancellous bone to get  into the lateral portion of the proximal femur for placement of the  initial starter reamer. The starter broaches was placed  the starter broach  and was shown to go down the center of the canal. Broaching  with the  Corail system was then performed starting at size 8, coursing  Up to size 12. A size 12 had excellent torsional and rotational  and axial stability. The trial standard offset neck was then  placed  with a 28 + 5 trial head. The hip was then reduced. We confirmed that  the stem was in the canal both on AP and lateral x-rays. It also has excellent sizing. The hip was reduced with outstanding stability through full extension and full external rotation.. AP pelvis was taken and the leg lengths were measured and found to be equal. Hip was then dislocated again and the femoral head and neck removed. The  femoral broach was removed. Size 12 Corail stem with a standard offset  neck was then impacted into the femur following native anteversion. Has  excellent purchase in the canal. Excellent torsional and rotational and  axial stability. It is confirmed to be in the canal on AP and lateral  fluoroscopic views. The 28 + 5 ceramic head was placed and the hip  reduced with outstanding stability. Again AP pelvis was taken and it  confirmed that the leg lengths were equal. The wound was then copiously  irrigated with saline solution and the capsule reattached and repaired  with Ethibond suture. 30 ml of .25% Bupivicaine was  injected into the capsule and into the edge of the tensor fascia lata as well as subcutaneous tissue. The fascia overlying the tensor fascia lata was then closed with a running #1 V-Loc. Subcu was closed with interrupted 2-0 Vicryl and subcuticular running 4-0 Monocryl. Incision was cleaned  and dried. Steri-Strips and a bulky sterile dressing applied. Hemovac  drain was hooked to suction and then the patient was awakened and transported to  recovery in stable condition.        Please note that a surgical assistant was a medical necessity for this procedure to perform it in a safe and expeditious manner. Assistant was necessary to provide appropriate retraction of vital neurovascular structures and to prevent femoral fracture and allow for anatomic placement of the prosthesis.  Gaynelle Arabian, M.D.

## 2017-06-17 NOTE — Interval H&P Note (Signed)
History and Physical Interval Note:  06/17/2017 8:00 AM  [name]  has presented today for surgery, with the diagnosis of Osteoarthritis Left hip  The various methods of treatment have been discussed with the patient and family. After consideration of risks, benefits and other options for treatment, the patient has consented to  Procedure(s): LEFT TOTAL HIP ARTHROPLASTY ANTERIOR APPROACH (Left) as a surgical intervention .  The patient's history has been reviewed, patient examined, no change in status, stable for surgery.  I have reviewed the patient's chart and labs.  Questions were answered to the patient's satisfaction.     Pilar Plate Casper Pagliuca

## 2017-06-18 LAB — CBC
HEMATOCRIT: 35.1 % — AB (ref 36.0–46.0)
HEMOGLOBIN: 11.6 g/dL — AB (ref 12.0–15.0)
MCH: 31.4 pg (ref 26.0–34.0)
MCHC: 33 g/dL (ref 30.0–36.0)
MCV: 95.1 fL (ref 78.0–100.0)
Platelets: 248 10*3/uL (ref 150–400)
RBC: 3.69 MIL/uL — AB (ref 3.87–5.11)
RDW: 12.5 % (ref 11.5–15.5)
WBC: 11.8 10*3/uL — ABNORMAL HIGH (ref 4.0–10.5)

## 2017-06-18 LAB — BASIC METABOLIC PANEL
Anion gap: 7 (ref 5–15)
BUN: 10 mg/dL (ref 6–20)
CHLORIDE: 104 mmol/L (ref 101–111)
CO2: 26 mmol/L (ref 22–32)
Calcium: 8.3 mg/dL — ABNORMAL LOW (ref 8.9–10.3)
Creatinine, Ser: 0.51 mg/dL (ref 0.44–1.00)
GFR calc non Af Amer: >60 mL/min (ref >60)
Glucose, Bld: 107 mg/dL — ABNORMAL HIGH (ref 65–99)
POTASSIUM: 3.6 mmol/L (ref 3.5–5.1)
SODIUM: 137 mmol/L (ref 135–145)

## 2017-06-18 MED ORDER — OXYCODONE HCL 5 MG PO TABS: 5.0000 mg | ORAL_TABLET | ORAL | 0 refills | Status: DC | PRN

## 2017-06-18 MED ORDER — RIVAROXABAN 10 MG PO TABS: 10.0000 mg | ORAL_TABLET | Freq: Every day | ORAL | 0 refills | Status: DC

## 2017-06-18 MED ORDER — TRAMADOL HCL 50 MG PO TABS: 50.0000 mg | ORAL_TABLET | Freq: Four times a day (QID) | ORAL | 0 refills | Status: DC | PRN

## 2017-06-18 MED ORDER — TRAMADOL HCL 50 MG PO TABS
50.0000 mg | ORAL_TABLET | Freq: Four times a day (QID) | ORAL | Status: DC | PRN
  Administered 2017-06-18: 100 mg via ORAL
  Filled 2017-06-18: qty 2

## 2017-06-18 MED ORDER — MUPIROCIN 2 % EX OINT: 1.0000 "application " | TOPICAL_OINTMENT | Freq: Two times a day (BID) | CUTANEOUS | Status: DC

## 2017-06-18 MED ORDER — CHLORHEXIDINE GLUCONATE CLOTH 2 % EX PADS: 6.0000 | MEDICATED_PAD | Freq: Every day | CUTANEOUS | Status: DC

## 2017-06-18 MED ORDER — METHOCARBAMOL 500 MG PO TABS: 500.0000 mg | ORAL_TABLET | Freq: Four times a day (QID) | ORAL | 0 refills | Status: DC | PRN

## 2017-06-18 NOTE — Discharge Summary (Signed)
Physician Discharge Summary   Patient ID: [name] MRN: [id number] DOB/AGE: July 20, 1955 61 y.o.  Admit date: 06/17/2017 Discharge date: 06-18-2017  Primary Diagnosis:  Osteoarthritis of the Left  hip.    Admission Diagnoses:  Past Medical History:  Diagnosis Date  . ADD (attention deficit disorder) without hyperactivity   . Cancer ([place])    Melanoma  . Complication of anesthesia   . PONV (postoperative nausea and vomiting)    Discharge Diagnoses:   Principal Problem:   OA (osteoarthritis) of hip  Estimated body mass index is 18.24 kg/m as calculated from the following:   Height as of this encounter: 5' 6"  (1.676 m).   Weight as of this encounter: 51.3 kg (113 lb).  Procedure(s) (LRB): LEFT TOTAL HIP ARTHROPLASTY ANTERIOR APPROACH (Left)   Consults: None  HPI: [name] is a 61 y.o. female who has advanced end-  stage arthritis of their Left  hip with progressively worsening pain and  dysfunction.The patient has failed nonoperative management and presents for  total hip arthroplasty.    Laboratory Data: Admission on 06/17/2017  Component Date Value Ref Range Status  . aPTT 06/15/2017 29  24 - 36 seconds Final  . WBC 06/15/2017 8.8  4.0 - 10.5 K/uL Final  . RBC 06/15/2017 4.43  3.87 - 5.11 MIL/uL Final  . Hemoglobin 06/15/2017 13.9  12.0 - 15.0 g/dL Final  . HCT 06/15/2017 41.8  36.0 - 46.0 % Final  . MCV 06/15/2017 94.4  78.0 - 100.0 fL Final  . MCH 06/15/2017 31.4  26.0 - 34.0 pg Final  . MCHC 06/15/2017 33.3  30.0 - 36.0 g/dL Final  . RDW 06/15/2017 12.5  11.5 - 15.5 % Final  . Platelets 06/15/2017 278  150 - 400 K/uL Final  . Sodium 06/15/2017 137  135 - 145 mmol/L Final  . Potassium 06/15/2017 4.0  3.5 - 5.1 mmol/L Final  . Chloride 06/15/2017 102  101 - 111 mmol/L Final  . CO2 06/15/2017 26  22 - 32 mmol/L Final  . Glucose, Bld 06/15/2017 91  65 - 99 mg/dL Final  . BUN 06/15/2017 17  6 - 20 mg/dL Final  . Creatinine, Ser 06/15/2017 0.58  0.44 -  1.00 mg/dL Final  . Calcium 06/15/2017 9.1  8.9 - 10.3 mg/dL Final  . Total Protein 06/15/2017 6.9  6.5 - 8.1 g/dL Final  . Albumin 06/15/2017 4.3  3.5 - 5.0 g/dL Final  . AST 06/15/2017 20  15 - 41 U/L Final  . ALT 06/15/2017 14  14 - 54 U/L Final  . Alkaline Phosphatase 06/15/2017 89  38 - 126 U/L Final  . Total Bilirubin 06/15/2017 0.7  0.3 - 1.2 mg/dL Final  . GFR calc non Af Amer 06/15/2017 >60  >60 mL/min Final  . GFR calc Af Amer 06/15/2017 >60  >60 mL/min Final   Comment: (NOTE) The eGFR has been calculated using the CKD EPI equation. This calculation has not been validated in all clinical situations. eGFR's persistently <60 mL/min signify possible Chronic Kidney Disease.   . Anion gap 06/15/2017 9  5 - 15 Final  . Prothrombin Time 06/15/2017 13.4  11.4 - 15.2 seconds Final  . INR 06/15/2017 1.02   Final  . ABO/RH(D) 06/15/2017 O POS   Final  . Antibody Screen 06/15/2017 NEG   Final  . Sample Expiration 06/15/2017 06/20/2017   Final  . Extend sample reason 06/15/2017 NO TRANSFUSIONS OR PREGNANCY IN THE PAST 3 MONTHS   Final  .  MRSA, PCR 06/15/2017 NEGATIVE  NEGATIVE Final  . Staphylococcus aureus 06/15/2017 POSITIVE* NEGATIVE Final   Comment: (NOTE) The Xpert SA Assay (FDA approved for NASAL specimens in patients 71 years of age and older), is one component of a comprehensive surveillance program. It is not intended to diagnose infection nor to guide or monitor treatment.   . WBC 06/18/2017 11.8* 4.0 - 10.5 K/uL Final  . RBC 06/18/2017 3.69* 3.87 - 5.11 MIL/uL Final  . Hemoglobin 06/18/2017 11.6* 12.0 - 15.0 g/dL Final  . HCT 06/18/2017 35.1* 36.0 - 46.0 % Final  . MCV 06/18/2017 95.1  78.0 - 100.0 fL Final  . MCH 06/18/2017 31.4  26.0 - 34.0 pg Final  . MCHC 06/18/2017 33.0  30.0 - 36.0 g/dL Final  . RDW 06/18/2017 12.5  11.5 - 15.5 % Final  . Platelets 06/18/2017 248  150 - 400 K/uL Final  . Sodium 06/18/2017 137  135 - 145 mmol/L Final  . Potassium 06/18/2017 3.6   3.5 - 5.1 mmol/L Final  . Chloride 06/18/2017 104  101 - 111 mmol/L Final  . CO2 06/18/2017 26  22 - 32 mmol/L Final  . Glucose, Bld 06/18/2017 107* 65 - 99 mg/dL Final  . BUN 06/18/2017 10  6 - 20 mg/dL Final  . Creatinine, Ser 06/18/2017 0.51  0.44 - 1.00 mg/dL Final  . Calcium 06/18/2017 8.3* 8.9 - 10.3 mg/dL Final  . GFR calc non Af Amer 06/18/2017 >60  >60 mL/min Final  . GFR calc Af Amer 06/18/2017 >60  >60 mL/min Final   Comment: (NOTE) The eGFR has been calculated using the CKD EPI equation. This calculation has not been validated in all clinical situations. eGFR's persistently <60 mL/min signify possible Chronic Kidney Disease.   Georgiann Hahn gap 06/18/2017 7  5 - 15 Final  Hospital Outpatient Visit on 06/15/2017  Component Date Value Ref Range Status  . ABO/RH(D) 06/15/2017 O POS   Final     X-Rays:Dg Pelvis Portable  Result Date: 06/17/2017 CLINICAL DATA:  Left anterior total hip replacement. EXAM: PORTABLE PELVIS 1-2 VIEWS COMPARISON:  Preoperative exams. FINDINGS: Two-view show total hip replacement on the left. Acetabular and femoral components appear well positioned. No radiographically detectable complication. Soft tissue drain in place. IMPRESSION: Good appearance following total hip arthroplasty on the left. Electronically Signed   By: Nelson Chimes M.D.   On: 06/17/2017 11:17   Dg C-arm 1-60 Min-no Report  Result Date: 06/17/2017 Fluoroscopy was utilized by the requesting physician.  No radiographic interpretation.    EKG: Orders placed or performed during the hospital encounter of 06/17/17  . EKG 12 lead  . EKG 12 lead     Hospital Course: Patient was admitted to [facility] and taken to the OR and underwent the above state procedure without complications.  Patient tolerated the procedure well and was later transferred to the recovery room and then to the orthopaedic floor for postoperative care.  They were given PO and IV analgesics for pain control  following their surgery.  They were given 24 hours of postoperative antibiotics of  Anti-infectives (From admission, onward)   Start     Dose/Rate Route Frequency Ordered Stop   06/17/17 1430  ceFAZolin (ANCEF) IVPB 1 g/50 mL premix     1 g 100 mL/hr over 30 Minutes Intravenous Every 6 hours 06/17/17 1152 06/17/17 2013   06/17/17 0641  ceFAZolin (ANCEF) 2-4 GM/100ML-% IVPB    Comments:  Algis Liming   : cabinet  override      06/17/17 0641 06/17/17 0830   06/17/17 0640  ceFAZolin (ANCEF) IVPB 2g/100 mL premix     2 g 200 mL/hr over 30 Minutes Intravenous On call to O.R. 06/17/17 6237 06/17/17 0840     and started on DVT prophylaxis in the form of Xarelto.   PT and OT were ordered for total hip protocol.  The patient was allowed to be WBAT with therapy. Discharge planning was consulted to help with postop disposition and equipment needs.  Patient had a decent night on the evening of surgery.  They started to get up OOB with therapy on day one.  Hemovac drain was pulled without difficulty.   Dressing  was clean and dry.    Patient was seen in rounds on day one by Dr. Wynelle Link and was ready to go home later that same day.   Discharge home with home health Diet - Regular diet Follow up - in 2 weeks Activity - WBAT Disposition - Home Condition Upon Discharge - Stable D/C Meds - See DC Summary DVT Prophylaxis - Xarelto    Discharge Instructions    Call MD / Call 911   Complete by:  As directed    If you experience chest pain or shortness of breath, CALL 911 and be transported to the hospital emergency room.  If you develope a fever above 101 F, pus (white drainage) or increased drainage or redness at the wound, or calf pain, call your surgeon's office.   Change dressing   Complete by:  As directed    Change dressing daily with sterile 4 x 4 inch gauze dressing and apply TED hose. Do not submerge the incision under water.   Constipation Prevention   Complete by:  As directed    Drink  plenty of fluids.  Prune juice may be helpful.  You may use a stool softener, such as Colace (over the counter) 100 mg twice a day.  Use MiraLax (over the counter) for constipation as needed.   Diet general   Complete by:  As directed    Discharge instructions   Complete by:  As directed    Take Xarelto for two and a half more weeks, then discontinue Xarelto. Once the patient has completed the blood thinner regimen, then take a Baby 81 mg Aspirin daily for three more weeks.   Pick up stool softner and laxative for home use following surgery while on pain medications. Do not submerge incision under water. Please use good hand washing techniques while changing dressing each day. May shower starting three days after surgery. Please use a clean towel to pat the incision dry following showers. Continue to use ice for pain and swelling after surgery. Do not use any lotions or creams on the incision until instructed by your surgeon.  Wear both TED hose on both legs during the day every day for three weeks, but may remove the TED hose at night at home.  Postoperative Constipation Protocol  Constipation - defined medically as fewer than three stools per week and severe constipation as less than one stool per week.  One of the most common issues patients have following surgery is constipation.  Even if you have a regular bowel pattern at home, your normal regimen is likely to be disrupted due to multiple reasons following surgery.  Combination of anesthesia, postoperative narcotics, change in appetite and fluid intake all can affect your bowels.  In order to avoid complications following surgery, here are  some recommendations in order to help you during your recovery period.  Colace (docusate) - Pick up an over-the-counter form of Colace or another stool softener and take twice a day as long as you are requiring postoperative pain medications.  Take with a full glass of water daily.  If you experience  loose stools or diarrhea, hold the colace until you stool forms back up.  If your symptoms do not get better within 1 week or if they get worse, check with your doctor.  Dulcolax (bisacodyl) - Pick up over-the-counter and take as directed by the product packaging as needed to assist with the movement of your bowels.  Take with a full glass of water.  Use this product as needed if not relieved by Colace only.   MiraLax (polyethylene glycol) - Pick up over-the-counter to have on hand.  MiraLax is a solution that will increase the amount of water in your bowels to assist with bowel movements.  Take as directed and can mix with a glass of water, juice, soda, coffee, or tea.  Take if you go more than two days without a movement. Do not use MiraLax more than once per day. Call your doctor if you are still constipated or irregular after using this medication for 7 days in a row.  If you continue to have problems with postoperative constipation, please contact the office for further assistance and recommendations.  If you experience "the worst abdominal pain ever" or develop nausea or vomiting, please contact the office immediatly for further recommendations for treatment.   Do not put a pillow under the knee. Place it under the heel.   Complete by:  As directed    Do not sit on low chairs, stoools or toilet seats, as it may be difficult to get up from low surfaces   Complete by:  As directed    Driving restrictions   Complete by:  As directed    No driving until released by the physician.   Increase activity slowly as tolerated   Complete by:  As directed    Lifting restrictions   Complete by:  As directed    No lifting until released by the physician.   Patient may shower   Complete by:  As directed    You may shower without a dressing once there is no drainage.  Do not wash over the wound.  If drainage remains, do not shower until drainage stops.   TED hose   Complete by:  As directed    Use  stockings (TED hose) for 3 weeks on both leg(s).  You may remove them at night for sleeping.   Weight bearing as tolerated   Complete by:  As directed    Laterality:  left   Extremity:  Lower     Allergies as of 06/18/2017      Reactions   Sulfa Antibiotics Other (See Comments)   Unknown was as a child      Medication List    STOP taking these medications   Calcium 500 MG Chew   estradiol 0.1 MG/GM vaginal cream Commonly known as:  ESTRACE   multivitamin with minerals tablet     TAKE these medications   amphetamine-dextroamphetamine 20 MG tablet Commonly known as:  ADDERALL Take 20 mg by mouth every morning.   fluticasone 50 MCG/ACT nasal spray Commonly known as:  FLONASE Place 2 sprays into the nose daily as needed for allergies.   methocarbamol 500 MG tablet Commonly known  as:  ROBAXIN Take 1 tablet (500 mg total) by mouth every 6 (six) hours as needed for muscle spasms.   oxyCODONE 5 MG immediate release tablet Commonly known as:  Oxy IR/ROXICODONE Take 1-2 tablets (5-10 mg total) by mouth every 4 (four) hours as needed for moderate pain or severe pain.   rivaroxaban 10 MG Tabs tablet Commonly known as:  XARELTO Take 1 tablet (10 mg total) by mouth daily with breakfast. Take Xarelto for two and a half more weeks following discharge from the hospital, then discontinue Xarelto. Once the patient has completed the blood thinner regimen, then take a Baby 81 mg Aspirin daily for three more weeks.   traMADol 50 MG tablet Commonly known as:  ULTRAM Take 1-2 tablets (50-100 mg total) by mouth every 6 (six) hours as needed for moderate pain.            Discharge Care Instructions  (From admission, onward)        Start     Ordered   06/18/17 0000  Weight bearing as tolerated    Question Answer Comment  Laterality left   Extremity Lower      06/18/17 0713   06/18/17 0000  Change dressing    Comments:  Change dressing daily with sterile 4 x 4 inch gauze  dressing and apply TED hose. Do not submerge the incision under water.   06/18/17 0871     Follow-up Information    Gaynelle Arabian, MD. Schedule an appointment as soon as possible for a visit on 06/30/2017.   Specialty:  Orthopedic Surgery Contact information: [address] [place] [address] [phone]           Signed: Arlee Muslim, PA-C Orthopaedic Surgery 06/18/2017, 7:14 AM

## 2017-06-18 NOTE — Progress Notes (Signed)
   Subjective: 1 Day Post-Op Procedure(s) (LRB): LEFT TOTAL HIP ARTHROPLASTY ANTERIOR APPROACH (Left) Patient reports pain as mild.   Patient seen in rounds by Dr. Wynelle Link. Patient is well, but has had some minor complaints of pain in the hip, requiring pain medications We will start therapy today.  If they do well with therapy and meets all goals, then will allow home later this afternoon following therapy. Plan is to go Home after hospital stay.  Objective: Vital signs in last 24 hours: Temp:  [96.4 F (35.8 C)-98.1 F (36.7 C)] 97.6 F (36.4 C) (12/06 0513) Pulse Rate:  [59-84] 82 (12/06 0513) Resp:  [10-19] 16 (12/06 0513) BP: (113-138)/(65-85) 119/66 (12/06 0513) SpO2:  [95 %-100 %] 98 % (12/06 0513)  Intake/Output from previous day:  Intake/Output Summary (Last 24 hours) at 06/18/2017 0707 Last data filed at 06/18/2017 0640 Gross per 24 hour  Intake 4571.25 ml  Output 2705 ml  Net 1866.25 ml    Intake/Output this shift: No intake/output data recorded.  Labs: Recent Labs    06/15/17 1156 06/18/17 0526  HGB 13.9 11.6*   Recent Labs    06/15/17 1156 06/18/17 0526  WBC 8.8 11.8*  RBC 4.43 3.69*  HCT 41.8 35.1*  PLT 278 248   Recent Labs    06/15/17 1156 06/18/17 0526  NA 137 137  K 4.0 3.6  CL 102 104  CO2 26 26  BUN 17 10  CREATININE 0.58 0.51  GLUCOSE 91 107*  CALCIUM 9.1 8.3*   Recent Labs    06/15/17 1156  INR 1.02    EXAM General - Patient is Alert, Appropriate and Oriented Extremity - Neurovascular intact Sensation intact distally Intact pulses distally Dorsiflexion/Plantar flexion intact Dressing - dressing C/D/I Motor Function - intact, moving foot and toes well on exam.  Hemovac pulled without difficulty.  Past Medical History:  Diagnosis Date  . ADD (attention deficit disorder) without hyperactivity   . Cancer ([place])    Melanoma  . Complication of anesthesia   . PONV (postoperative nausea and vomiting)      Assessment/Plan: 1 Day Post-Op Procedure(s) (LRB): LEFT TOTAL HIP ARTHROPLASTY ANTERIOR APPROACH (Left) Principal Problem:   OA (osteoarthritis) of hip  Estimated body mass index is 18.24 kg/m as calculated from the following:   Height as of this encounter: 5\' 6"  (1.676 m).   Weight as of this encounter: 51.3 kg (113 lb). Advance diet Up with therapy Discharge home with home health  DVT Prophylaxis - Xarelto Weight Bearing As Tolerated left Leg Hemovac Pulled Begin Therapy  If meets goals and able to go home: Discharge home with home health Diet - Regular diet Follow up - in 2 weeks Activity - WBAT Disposition - Home Condition Upon Discharge - Stable D/C Meds - See DC Summary DVT Prophylaxis - Xarelto  Arlee Muslim, PA-C Orthopaedic Surgery 06/18/2017, 7:07 AM

## 2017-06-18 NOTE — Progress Notes (Signed)
Physical Therapy Treatment Patient Details Name: [name] MRN: [id number] DOB: 1955/11/23 Today's Date: 06/18/2017    History of Present Illness Pt s/p L THR     PT Comments    POD 1  Daughter from [place] present and plans to assist pt at home initially.  Ambulated 150 feet to stairs with RW, required VC's for sequence. Performed 4 stairs with handrail on L, no AD, required VC's for sequence. Pushed back to the room in recliner. Performed therex. Instructed patient and daughter on ice use, car transfers, and safety. Informed RN of patients progress and ready to D/C to home.   Follow Up Recommendations  DC plan and follow up therapy as arranged by surgeon [facility]     Equipment Recommendations  Rolling walker with 5" wheels    Recommendations for Other Services       Precautions / Restrictions Precautions Precautions: Fall Restrictions Weight Bearing Restrictions: No Other Position/Activity Restrictions: WBAT    Mobility  Bed Mobility               General bed mobility comments: OOB  Transfers Overall transfer level: Needs assistance Equipment used: Rolling walker (2 wheeled) Transfers: Sit to/from Stand Sit to Stand: Min guard         General transfer comment: min guard for safety  Ambulation/Gait Ambulation/Gait assistance: Min guard;Supervision Ambulation Distance (Feet): 150 Feet Assistive device: Rolling walker (2 wheeled) Gait Pattern/deviations: Step-to pattern;Decreased step length - right;Decreased step length - left;Shuffle;Trunk flexed Gait velocity: decr Gait velocity interpretation: Below normal speed for age/gender General Gait Details: VC's for sequence   Stairs Stairs: Yes   Stair Management: Step to pattern;Forwards;One rail Right Number of Stairs: 4 General stair comments: VC's for sequence, hand rail on R, ascend and descend with no AD, patient used B UE's on rail. B UE's on one rail Daughter present for education Wheelchair  Mobility    Modified Rankin (Stroke Patients Only)       Balance                                            Cognition Arousal/Alertness: Awake/alert Behavior During Therapy: WFL for tasks assessed/performed Overall Cognitive Status: Within Functional Limits for tasks assessed                                        Exercises Total Joint Exercises Ankle Circles/Pumps: AROM;Both;Supine;20 reps Quad Sets: AROM;Left;10 reps;Seated Towel Squeeze: AROM;Left;Seated;10 reps Short Arc Quad: AROM;10 reps;Left;Seated Heel Slides: AROM;Left;Seated;10 reps Hip ABduction/ADduction: AROM;Left;Seated;10 reps Long Arc Quad: AROM;Left;Seated;10 reps Marching in Standing: AROM;Left;10 reps;Standing Standing Hip Extension: AROM;Left;Standing;10 reps    General Comments        Pertinent Vitals/Pain Pain Assessment: 0-10 Pain Score: 3  Pain Location: L hip Pain Descriptors / Indicators: Aching;Sore Pain Intervention(s): Limited activity within patient's tolerance;Monitored during session;Ice applied;Repositioned    Home Living                      Prior Function            PT Goals (current goals can now be found in the care plan section)      Frequency    7X/week      PT Plan  Co-evaluation              AM-PAC PT "6 Clicks" Daily Activity  Outcome Measure  Difficulty turning over in bed (including adjusting bedclothes, sheets and blankets)?: A Little Difficulty moving from lying on back to sitting on the side of the bed? : A Little Difficulty sitting down on and standing up from a chair with arms (e.g., wheelchair, bedside commode, etc,.)?: A Little Help needed moving to and from a bed to chair (including a wheelchair)?: A Little Help needed walking in hospital room?: A Little Help needed climbing 3-5 steps with a railing? : A Little 6 Click Score: 18    End of Session Equipment Utilized During Treatment: Gait  belt Activity Tolerance: Patient tolerated treatment well Patient left: in chair;with call bell/phone within reach;with family/visitor present Nurse Communication: Mobility status PT Visit Diagnosis: Unsteadiness on feet (R26.81);Difficulty in walking, not elsewhere classified (R26.2)     Time: 0950-1020 PT Time Calculation (min) (ACUTE ONLY): 30 min  Charges:  $Gait Training: 8-22 mins $Therapeutic Exercise: 8-22 mins                    G Codes:       Almond Lint, SPTA [facility] [place]  PTA [facility]  Acute  Rehab Pager      [phone]

## 2017-06-18 NOTE — Progress Notes (Signed)
Discharge planning, spoke with patient at bedside. Have chosen [facility] for HH PT. Contacted [facility] for referral. Needs RW and declines 3n1, contacted [facility] to deliver RW to room. [phone] 

## 2018-01-25 IMAGING — MR MR HIP*L* W/CM
4 of 5 series · 19 of 40 positions shown · IV contrast (agent unspecified)
Comparison: None.

CLINICAL DATA: Left hip pain since exercising in January 2016.
Question labral tear.

EXAM:
MRI OF THE LEFT HIP WITH CONTRAST(MR Arthrogram)
TECHNIQUE: Multiplanar, multisequence MR imaging of the hip was performed
immediately following contrast injection into the hip joint under
fluoroscopic guidance. No intravenous contrast was administered.

[Series 3: T2 fat-sat · coronal · 4.0mm · 0.74mm/px · 8 of 19 slices shown]
[im 1/19]
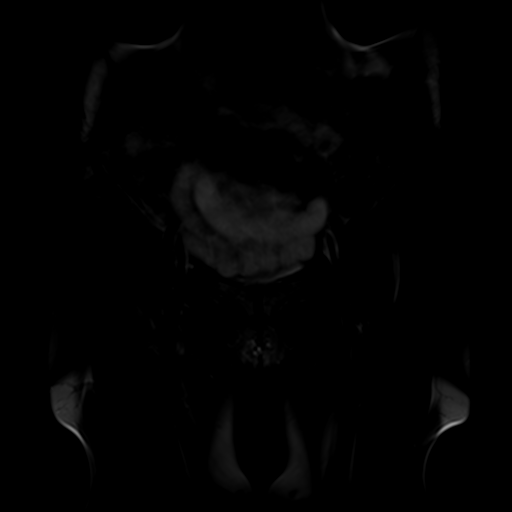
[im 3/19]
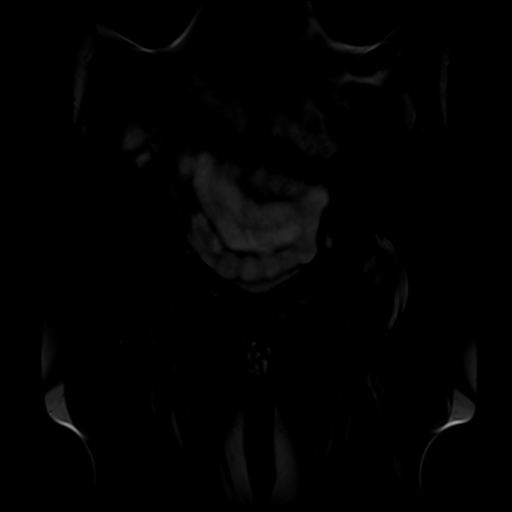
[im 6/19]
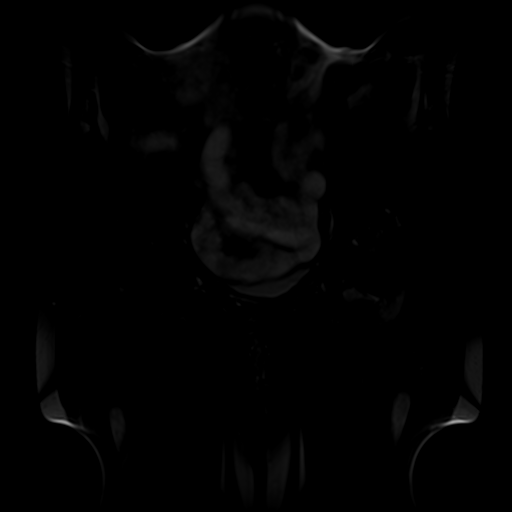
[im 8/19]
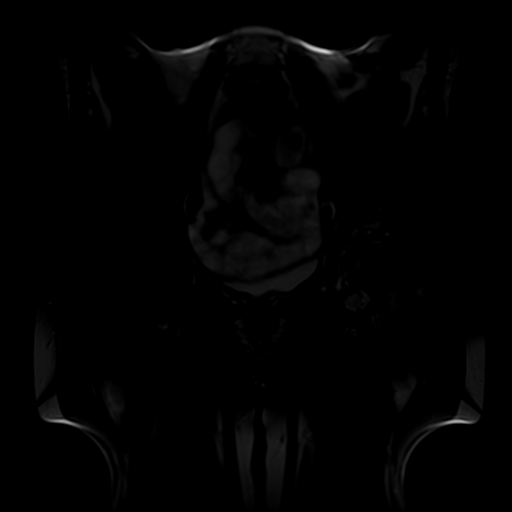
[im 11/19]
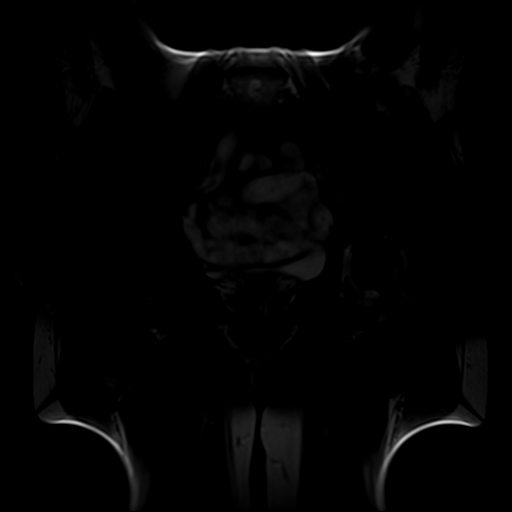
[im 13/19]
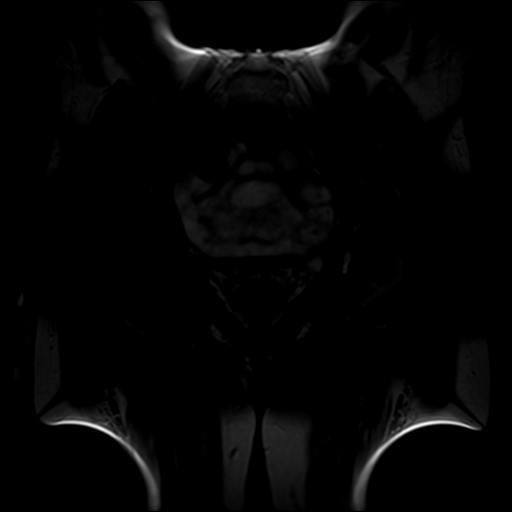
[im 16/19]
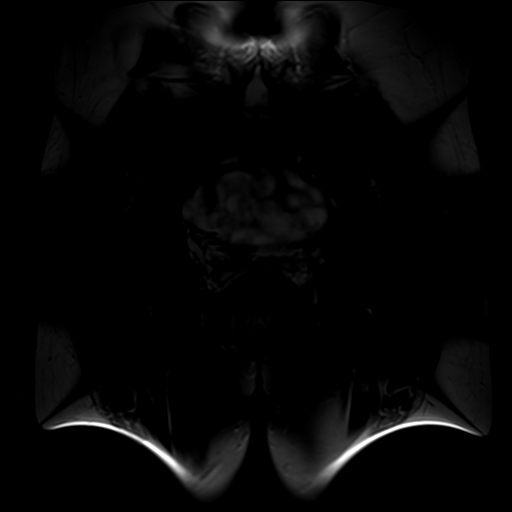
[im 19/19]
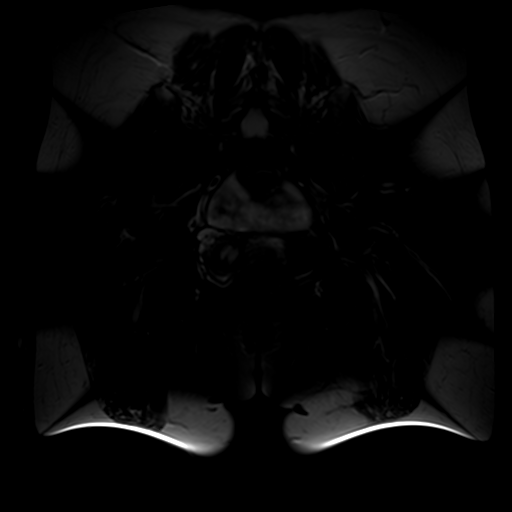

[Series 4: T1 · coronal · 4.0mm · 0.74mm/px · 5 of 19 slices shown]
[im 1/19]
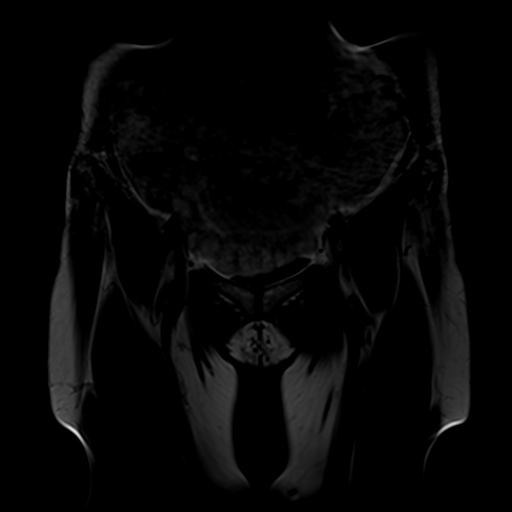
[im 3/19]
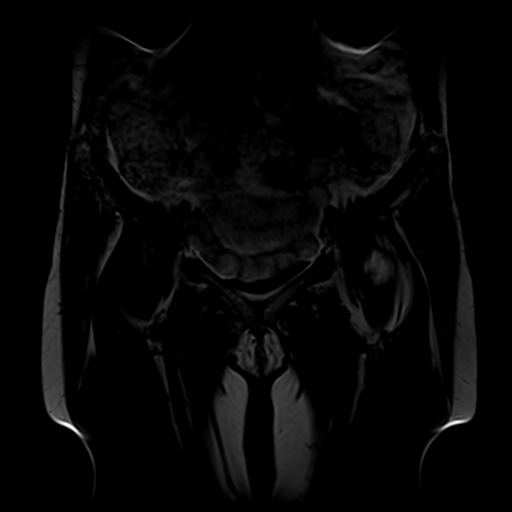
[im 6/19]
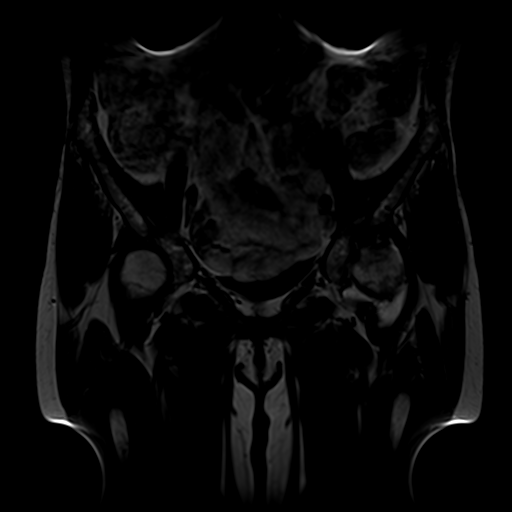
[im 11/19]
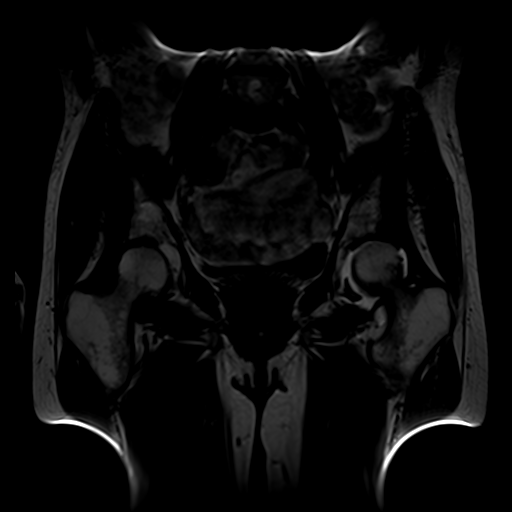
[im 16/19]
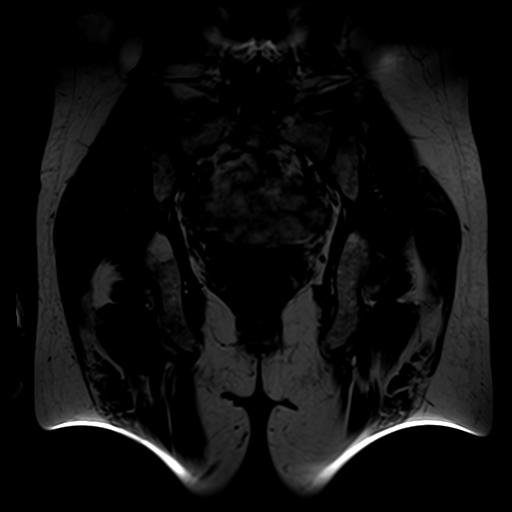

[Series 5: T1 fat-sat · coronal · 4.0mm · 0.35mm/px · 3 of 21 slices shown (1 of 2)]
[im 3/21]
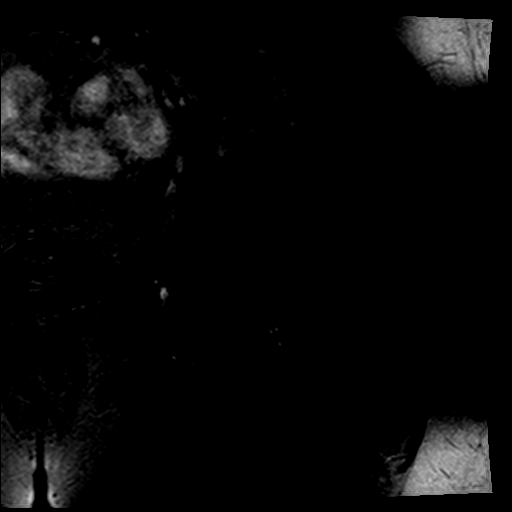
[im 12/21]
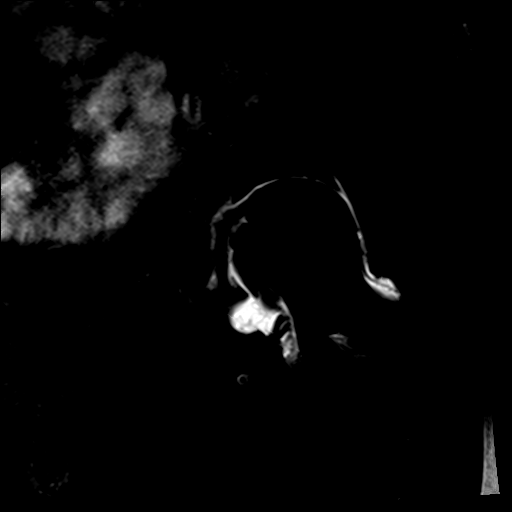
[im 18/21]
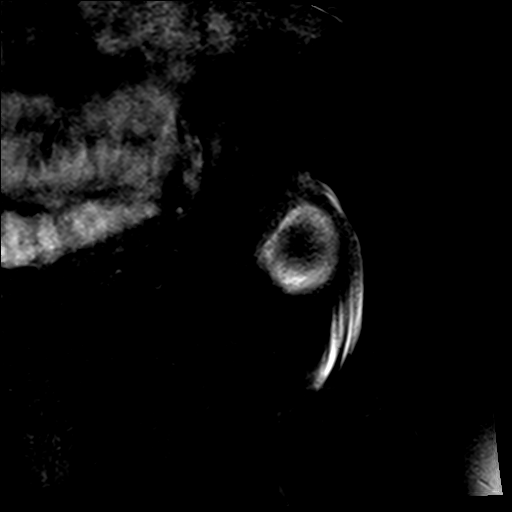

[Series 6: T1 fat-sat · axial · 4.0mm · 0.35mm/px · z∈[-51,+5]mm · 3 of 20 slices shown (2 of 2)]
[im 3/20]
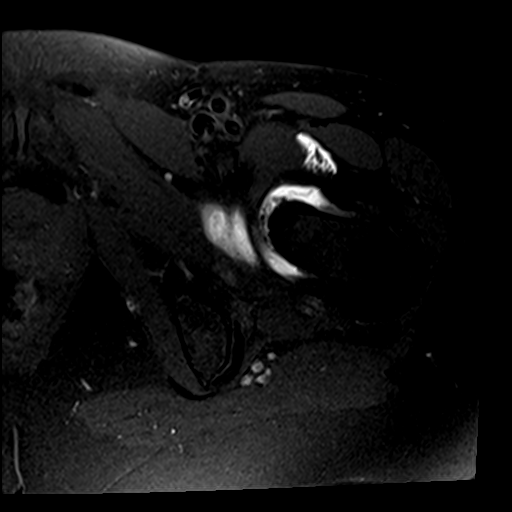
[im 11/20]
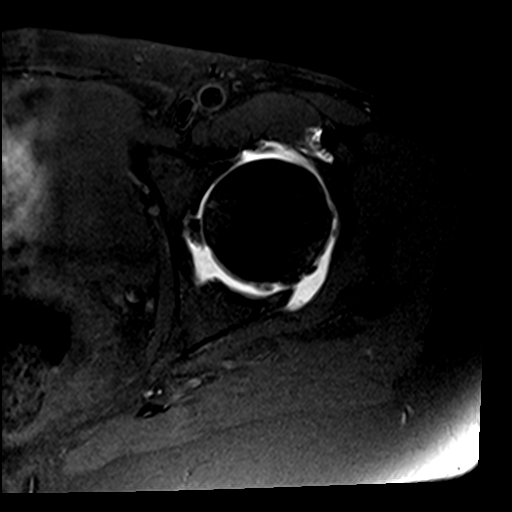
[im 17/20]
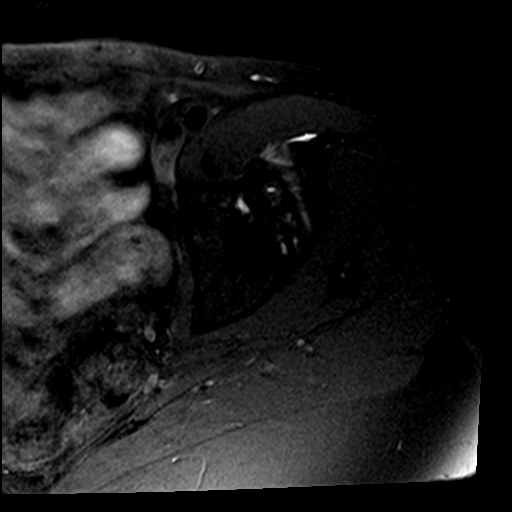

[19 of 40 positions shown; findings below may reference images not displayed]

FINDINGS: Bones: There is no fracture or evidence of avascular necrosis.
Marrow edema and subchondral cyst formation are seen about the left
hip extending into the left femoral neck.

Articular cartilage and labrum

Articular cartilage: There is bone-on-bone joint space narrowing of
the left hip. Much milder degree of degenerative change about the
right hip is noted.

Labrum:  The labrum is degenerated without discrete tear identified.

Joint or bursal effusion

Joint effusion: The left hip is distended with contrast. No right
hip effusion.

Bursae:  Negative.

Muscles and tendons

Muscles and tendons:  Intact.

Other findings

Miscellaneous:  None.
IMPRESSION: Advanced left hip osteoarthritis with bone-on-bone joint space
narrowing and extensive marrow edema about the joint consistent with
stress change. No fracture. Associated degeneration of the labrum
without focal tear is noted

## 2018-10-23 ENCOUNTER — Encounter (HOSPITAL_COMMUNITY): Payer: Self-pay | Admitting: *Deleted

## 2018-10-23 ENCOUNTER — Ambulatory Visit (HOSPITAL_COMMUNITY)
Admission: EM | Admit: 2018-10-23 | Discharge: 2018-10-23 | Disposition: A | Discharge status: Home / Self Care | Payer: 59 | Attending: Physician Assistant | Admitting: Physician Assistant

## 2018-10-23 ENCOUNTER — Other Ambulatory Visit: Payer: Self-pay

## 2018-10-23 DIAGNOSIS — I10 Essential (primary) hypertension: Secondary | ICD-10-CM | POA: Diagnosis not present

## 2018-10-23 DIAGNOSIS — N3090 Cystitis, unspecified without hematuria: Secondary | ICD-10-CM | POA: Diagnosis not present

## 2018-10-23 HISTORY — DX: Essential (primary) hypertension: I10

## 2018-10-23 LAB — POCT URINALYSIS DIP (DEVICE)
Bilirubin Urine: NEGATIVE
Glucose, UA: NEGATIVE mg/dL
Nitrite: NEGATIVE
Protein, ur: >=300 mg/dL — AB
Specific Gravity, Urine: >=1.03 (ref 1.005–1.030)
Urobilinogen, UA: 1 mg/dL (ref 0.0–1.0)
pH: 6.5 (ref 5.0–8.0)

## 2018-10-23 MED ORDER — CEPHALEXIN 500 MG PO CAPS: 500.0000 mg | ORAL_CAPSULE | Freq: Four times a day (QID) | ORAL | 0 refills | Status: DC

## 2018-10-23 NOTE — Discharge Instructions (Signed)
Your urine was positive for an urinary tract infection. Start keflex as directed. Keep hydrated, urine should be clear to pale yellow in color. Monitor for any worsening of symptoms, fever, worsening abdominal pain, nausea/vomiting, flank pain, follow up for reevaluation.  ° °

## 2018-10-23 NOTE — ED Provider Notes (Signed)
[name]    CSN: [id number] Arrival date & time: 10/23/18  1031     History   Chief Complaint Chief Complaint  Patient presents with  . Dysuria    HPI [name] is a 63 y.o. female.   63 year old female comes in for 3-day history of urinary symptoms.  Patient states has had recurrent symptoms since beginning of March.  She experiences dysuria, frequency, urgency.  States first episode, she was traveling to [place], so was given an antibiotic without dipstick.  Symptoms resolved after antibiotics, but returned after a few days.  Second episode, she did a ED visit, and was given nitrofurantoin, also without dipstick.  She denies abdominal pain, nausea, vomiting.  Denies fever, chills, night sweats.  Denies vaginal discharge, itching.  She rarely gets urinary tract infections, was wondering if traveling to [place] could have caused it, as she does not like to use the restroom on the airplane.  Otherwise no obvious changes in hygiene products.  History of hysterectomy.     Past Medical History:  Diagnosis Date  . ADD (attention deficit disorder) without hyperactivity   . Cancer ([place])    Melanoma  . Complication of anesthesia   . Hypertension   . PONV (postoperative nausea and vomiting)     Patient Active Problem List   Diagnosis Date Noted  . OA (osteoarthritis) of hip 06/17/2017  . Left hip pain 05/04/2016    Past Surgical History:  Procedure Laterality Date  . ABDOMINAL HYSTERECTOMY    . AUGMENTATION MAMMAPLASTY    . JOINT REPLACEMENT    . left hip replacement Left 06/17/2017  . MELANOMA EXCISION WITH SENTINEL LYMPH NODE BIOPSY     1995  . TOTAL HIP ARTHROPLASTY Left 06/17/2017   Procedure: LEFT TOTAL HIP ARTHROPLASTY ANTERIOR APPROACH;  Surgeon: Gaynelle Arabian, MD;  Location: [facility];  Service: Orthopedics;  Laterality: Left;    OB History   No obstetric history on file.      Home Medications    Prior to Admission medications    Medication Sig Start Date End Date Taking? Authorizing Provider  amphetamine-dextroamphetamine (ADDERALL) 20 MG tablet Take 20 mg by mouth every morning. 03/12/16  Yes [provider]  LISINOPRIL PO Take by mouth.   Yes [provider]  cephALEXin (KEFLEX) 500 MG capsule Take 1 capsule (500 mg total) by mouth 4 (four) times daily. 10/23/18   Tasia Catchings, Alessandro Griep V, PA-C  fluticasone (FLONASE) 50 MCG/ACT nasal spray Place 2 sprays into the nose daily as needed for allergies.     [provider]    Family History History reviewed. No pertinent family history.  Social History Social History   Tobacco Use  . Smoking status: Former Smoker    Last attempt to quit: 1985    Years since quitting: 35.2  . Smokeless tobacco: Never Used  Substance Use Topics  . Alcohol use: Yes    Alcohol/week: 4.0 standard drinks    Types: 4 Glasses of wine per week    Comment: occasionally  . Drug use: No     Allergies   Sulfa antibiotics   Review of Systems Review of Systems  Reason unable to perform ROS: See HPI as above.     Physical Exam Triage Vital Signs ED Triage Vitals  Enc Vitals Group     BP 10/23/18 1039 (!) 162/96     Pulse Rate 10/23/18 1039 91     Resp 10/23/18 1039 16  Temp 10/23/18 1039 97.8 F (36.6 C)     Temp Source 10/23/18 1039 Oral     SpO2 10/23/18 1039 100 %     Weight --      Height --      Head Circumference --      Peak Flow --      Pain Score 10/23/18 1040 0     Pain Loc --      Pain Edu? --      Excl. in [place]? --    No data found.  Updated Vital Signs BP (!) 162/96 Comment: took meds @ 0800  Pulse 91   Temp 97.8 F (36.6 C) (Oral)   Resp 16   SpO2 100%    Physical Exam Constitutional:      General: She is not in acute distress.    Appearance: She is well-developed. She is not ill-appearing, toxic-appearing or diaphoretic.  HENT:     Head: Normocephalic and atraumatic.  Eyes:     Conjunctiva/sclera: Conjunctivae normal.      Pupils: Pupils are equal, round, and reactive to light.  Cardiovascular:     Rate and Rhythm: Normal rate and regular rhythm.     Heart sounds: Normal heart sounds. No murmur. No friction rub. No gallop.   Pulmonary:     Effort: Pulmonary effort is normal.     Breath sounds: Normal breath sounds. No wheezing or rales.  Abdominal:     General: Bowel sounds are normal.     Palpations: Abdomen is soft.     Tenderness: There is no abdominal tenderness. There is no right CVA tenderness, left CVA tenderness, guarding or rebound.  Skin:    General: Skin is warm and dry.  Neurological:     Mental Status: She is alert and oriented to person, place, and time.  Psychiatric:        Behavior: Behavior normal.        Judgment: Judgment normal.      UC Treatments / Results  Labs (all labs ordered are listed, but only abnormal results are displayed) Labs Reviewed  POCT URINALYSIS DIP (DEVICE) - Abnormal; Notable for the following components:      Result Value   Ketones, ur TRACE (*)    Hgb urine dipstick SMALL (*)    Protein, ur >=300 (*)    Leukocytes,Ua SMALL (*)    All other components within normal limits  URINE CULTURE    EKG None  Radiology No results found.  Procedures Procedures (including critical care time)  Medications Ordered in UC Medications - No data to display  Initial Impression / Assessment and Plan / UC Course  I have reviewed the triage vital signs and the nursing notes.  Pertinent labs & imaging results that were available during my care of the patient were reviewed by me and considered in my medical decision making (see chart for details).    Patient does not recall first antibiotic, just finished macrobid. Small leuks, but with significant symptoms, will cover with keflex. Has not had urine culture, will send for testing. Push fluids. Return precautions given. Patient expresses understanding and agrees to plan.  Final Clinical Impressions(s) / UC  Diagnoses   Final diagnoses:  Cystitis   ED Prescriptions    Medication Sig Dispense Auth. Provider   cephALEXin (KEFLEX) 500 MG capsule Take 1 capsule (500 mg total) by mouth 4 (four) times daily. 28 capsule Ok Edwards, PA-C  Ok Edwards, PA-C 10/23/18 1108

## 2018-10-23 NOTE — ED Triage Notes (Signed)
Has done 2 rounds abx since beginning of March; most recently finished 2nd round 3 days ago.  Got improvement of sxs both times, but sxs return.  C/O dysuria and polyuria without fevers.  Denies any abd or back pain.

## 2018-10-23 NOTE — ED Notes (Signed)
Patient verbalizes understanding of discharge instructions. Opportunity for questioning and answers were provided. Patient discharged from [facility] by provider.  

## 2018-10-25 ENCOUNTER — Telehealth (HOSPITAL_COMMUNITY): Payer: Self-pay | Admitting: Emergency Medicine

## 2018-10-25 LAB — URINE CULTURE: Culture: >=100000 — AB

## 2018-10-25 NOTE — Telephone Encounter (Signed)
Urine culture was positive for e coli and was given keflex  at urgent care visit. Attempted to reach patient. No answer at this time.   

## 2018-11-24 IMAGING — DX DG PORTABLE PELVIS
2 series · 2 of 2 positions shown · non-contrast
Comparison: Preoperative exams.

CLINICAL DATA: Left anterior total hip replacement.

EXAM:
PORTABLE PELVIS 1-2 VIEWS

[pelvis ap]
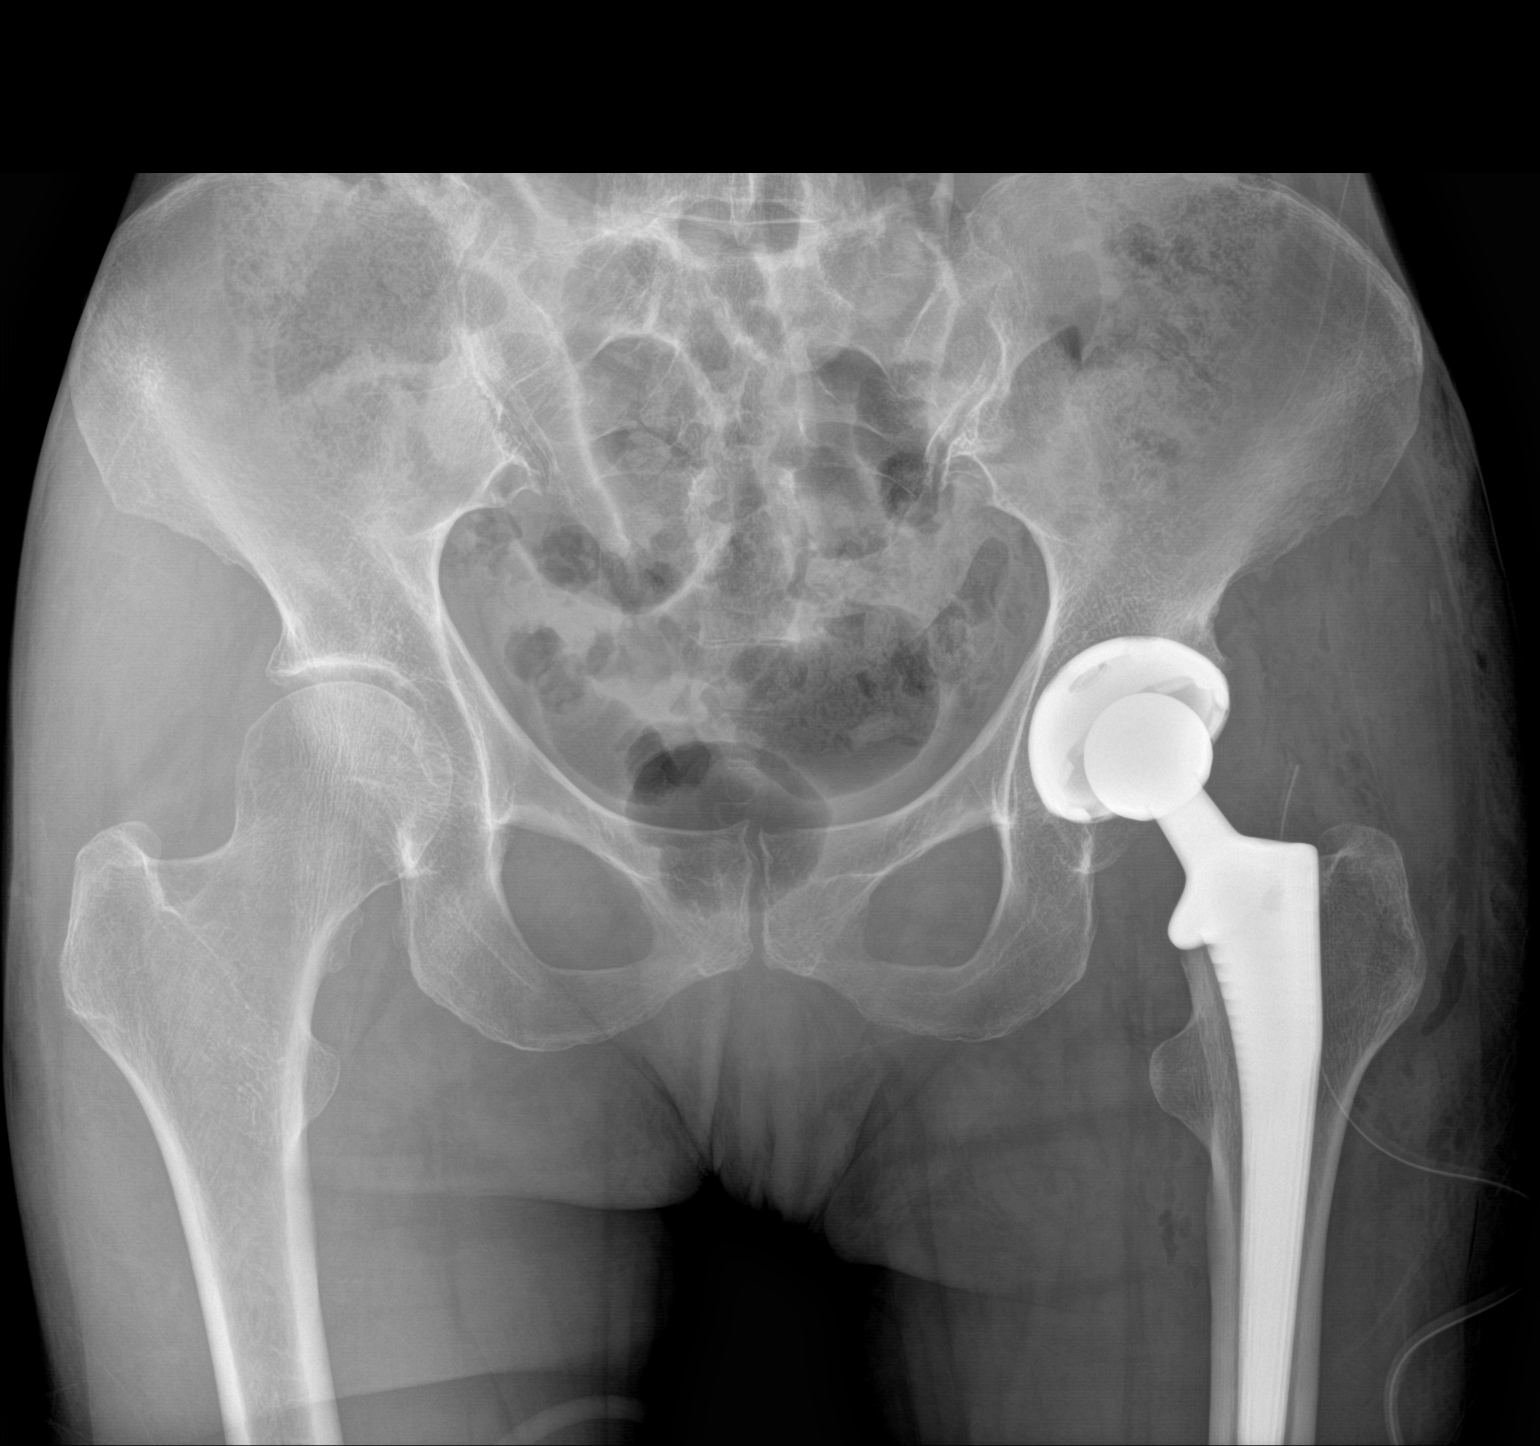

[pelvis lat]
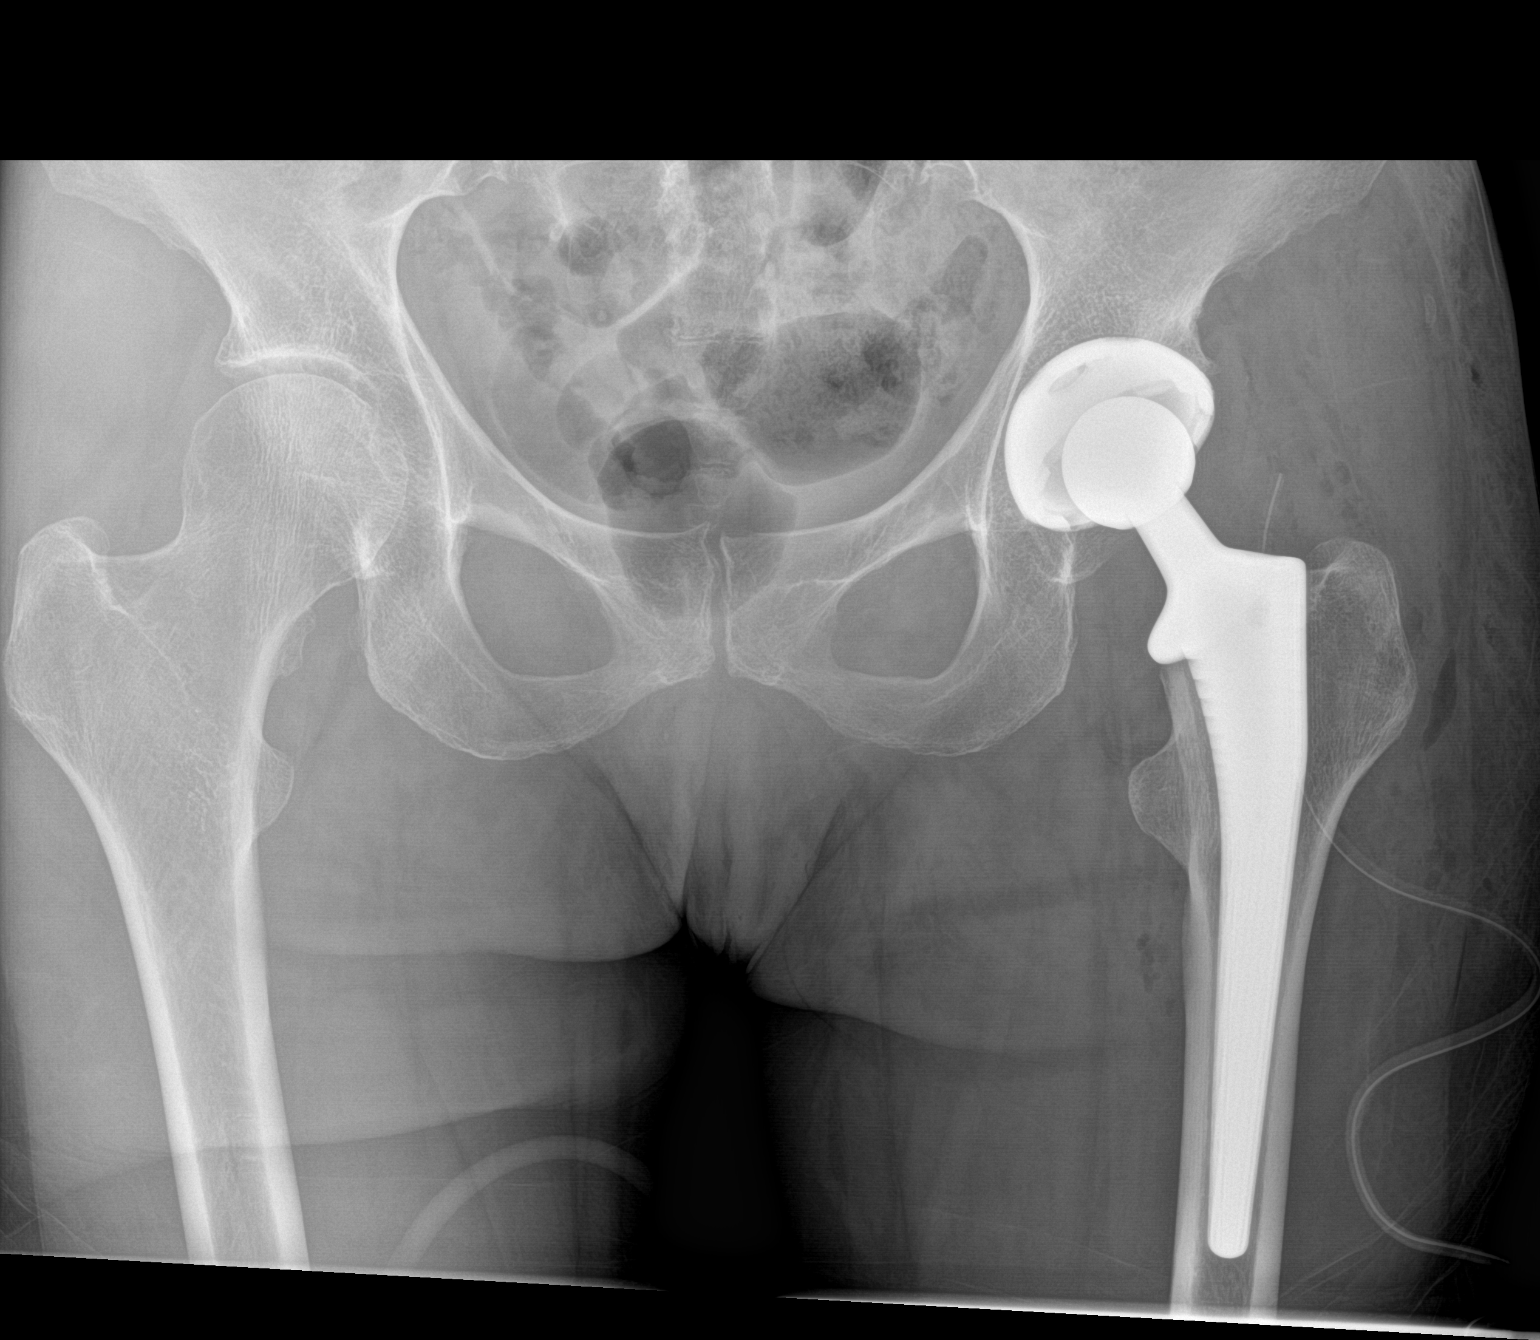

[2 of 2 positions shown; findings below may reference images not displayed]

FINDINGS: Two-view show total hip replacement on the left. Acetabular and
femoral components appear well positioned. No radiographically
detectable complication. Soft tissue drain in place.
IMPRESSION: Good appearance following total hip arthroplasty on the left.

## 2019-12-11 ENCOUNTER — Other Ambulatory Visit: Payer: Self-pay

## 2019-12-11 ENCOUNTER — Ambulatory Visit (HOSPITAL_COMMUNITY)
Admission: EM | Admit: 2019-12-11 | Discharge: 2019-12-11 | Disposition: A | Discharge status: Home / Self Care | Payer: 59 | Attending: Family Medicine | Admitting: Family Medicine

## 2019-12-11 ENCOUNTER — Encounter (HOSPITAL_COMMUNITY): Payer: Self-pay

## 2019-12-11 DIAGNOSIS — N390 Urinary tract infection, site not specified: Secondary | ICD-10-CM | POA: Diagnosis present

## 2019-12-11 LAB — POCT URINALYSIS DIP (DEVICE)
Bilirubin Urine: NEGATIVE
Glucose, UA: NEGATIVE mg/dL
Ketones, ur: 15 mg/dL — AB
Nitrite: NEGATIVE
Protein, ur: 30 mg/dL — AB
Specific Gravity, Urine: 1.025 (ref 1.005–1.030)
Urobilinogen, UA: 0.2 mg/dL (ref 0.0–1.0)
pH: 7 (ref 5.0–8.0)

## 2019-12-11 MED ORDER — CEPHALEXIN 500 MG PO CAPS: 500.0000 mg | ORAL_CAPSULE | Freq: Two times a day (BID) | ORAL | 0 refills | Status: AC

## 2019-12-11 NOTE — ED Triage Notes (Signed)
Pt presents with urinary tract symptoms; pt states she is having urinary urgency and burning with urination X 1 week.

## 2019-12-11 NOTE — Discharge Instructions (Signed)
Treating you for a urinary tract infection.  We will send for culture.  Make sure you are drinking plenty of water Follow up as needed for continued or worsening symptoms

## 2019-12-11 NOTE — ED Provider Notes (Signed)
[name]    CSN: [id number] Arrival date & time: 12/11/19  1006      History   Chief Complaint Chief Complaint  Patient presents with  . Urinary Tract Infection    HPI [name] is a 64 y.o. female.   Patient is a 64 year old female presents today with urinary urgency, dysuria and frequency x1 week.  Symptoms have been constant.  She has been drinking plenty water but has not take anything for symptoms.  No associated abdominal pain, back pain, flank pain, fevers, nausea or vomiting.  ROS per HPI      Past Medical History:  Diagnosis Date  . ADD (attention deficit disorder) without hyperactivity   . Cancer ([place])    Melanoma  . Complication of anesthesia   . Hypertension   . PONV (postoperative nausea and vomiting)     Patient Active Problem List   Diagnosis Date Noted  . OA (osteoarthritis) of hip 06/17/2017  . Left hip pain 05/04/2016    Past Surgical History:  Procedure Laterality Date  . ABDOMINAL HYSTERECTOMY    . AUGMENTATION MAMMAPLASTY    . JOINT REPLACEMENT    . left hip replacement Left 06/17/2017  . MELANOMA EXCISION WITH SENTINEL LYMPH NODE BIOPSY     1995  . TOTAL HIP ARTHROPLASTY Left 06/17/2017   Procedure: LEFT TOTAL HIP ARTHROPLASTY ANTERIOR APPROACH;  Surgeon: Gaynelle Arabian, MD;  Location: [facility];  Service: Orthopedics;  Laterality: Left;    OB History   No obstetric history on file.      Home Medications    Prior to Admission medications   Medication Sig Start Date End Date Taking? Authorizing Provider  amphetamine-dextroamphetamine (ADDERALL) 20 MG tablet Take 20 mg by mouth every morning. 03/12/16   [provider]  cephALEXin (KEFLEX) 500 MG capsule Take 1 capsule (500 mg total) by mouth 2 (two) times daily for 5 days. 12/11/19 12/16/19  Loura Halt A, NP  fluticasone (FLONASE) 50 MCG/ACT nasal spray Place 2 sprays into the nose daily as needed for allergies.     [provider]  LISINOPRIL PO  Take by mouth.    [provider]    Family History History reviewed. No pertinent family history.  Social History Social History   Tobacco Use  . Smoking status: Former Smoker    Quit date: 1985    Years since quitting: 36.4  . Smokeless tobacco: Never Used  Substance Use Topics  . Alcohol use: Yes    Alcohol/week: 4.0 standard drinks    Types: 4 Glasses of wine per week    Comment: occasionally  . Drug use: No     Allergies   Sulfa antibiotics   Review of Systems Review of Systems   Physical Exam Triage Vital Signs ED Triage Vitals  Enc Vitals Group     BP 12/11/19 1047 (!) 138/93     Pulse Rate 12/11/19 1047 86     Resp 12/11/19 1047 18     Temp 12/11/19 1047 98.2 F (36.8 C)     Temp Source 12/11/19 1047 Oral     SpO2 12/11/19 1047 100 %     Weight --      Height --      Head Circumference --      Peak Flow --      Pain Score 12/11/19 1050 5     Pain Loc --      Pain Edu? --  Excl. in [place]? --    No data found.  Updated Vital Signs BP (!) 138/93 (BP Location: Right Arm)   Pulse 86   Temp 98.2 F (36.8 C) (Oral)   Resp 18   SpO2 100%   Visual Acuity Right Eye Distance:   Left Eye Distance:   Bilateral Distance:    Right Eye Near:   Left Eye Near:    Bilateral Near:     Physical Exam Vitals and nursing note reviewed.  Constitutional:      General: She is not in acute distress.    Appearance: Normal appearance. She is not ill-appearing, toxic-appearing or diaphoretic.  HENT:     Head: Normocephalic.     Nose: Nose normal.  Eyes:     Conjunctiva/sclera: Conjunctivae normal.  Pulmonary:     Effort: Pulmonary effort is normal.  Musculoskeletal:        General: Normal range of motion.     Cervical back: Normal range of motion.  Skin:    General: Skin is warm and dry.     Findings: No rash.  Neurological:     Mental Status: She is alert.  Psychiatric:        Mood and Affect: Mood normal.      UC Treatments /  Results  Labs (all labs ordered are listed, but only abnormal results are displayed) Labs Reviewed  POCT URINALYSIS DIP (DEVICE) - Abnormal; Notable for the following components:      Result Value   Ketones, ur 15 (*)    Hgb urine dipstick SMALL (*)    Protein, ur 30 (*)    Leukocytes,Ua LARGE (*)    All other components within normal limits  URINE CULTURE    EKG   Radiology No results found.  Procedures Procedures (including critical care time)  Medications Ordered in UC Medications - No data to display  Initial Impression / Assessment and Plan / UC Course  I have reviewed the triage vital signs and the nursing notes.  Pertinent labs & imaging results that were available during my care of the patient were reviewed by me and considered in my medical decision making (see chart for details).     Lower UTI Urine with large leuks and small hemoglobin. Sending for culture.  We will go ahead and treat with Keflex for urinary tract infection based on urinalysis and symptoms. Recommend increase water intake Follow up as needed for continued or worsening symptoms  Final Clinical Impressions(s) / UC Diagnoses   Final diagnoses:  Lower urinary tract infectious disease     Discharge Instructions     Treating you for a urinary tract infection.  We will send for culture.  Make sure you are drinking plenty of water Follow up as needed for continued or worsening symptoms     ED Prescriptions    Medication Sig Dispense Auth. Provider   cephALEXin (KEFLEX) 500 MG capsule Take 1 capsule (500 mg total) by mouth 2 (two) times daily for 5 days. 10 capsule Loura Halt A, NP     PDMP not reviewed this encounter.   Loura Halt A, NP 12/11/19 1104

## 2019-12-14 LAB — URINE CULTURE: Culture: 40000 — AB

## 2020-01-27 ENCOUNTER — Ambulatory Visit (HOSPITAL_COMMUNITY)
Admission: EM | Admit: 2020-01-27 | Discharge: 2020-01-27 | Disposition: A | Discharge status: Home / Self Care | Payer: 59 | Attending: Urgent Care | Admitting: Urgent Care

## 2020-01-27 ENCOUNTER — Encounter (HOSPITAL_COMMUNITY): Payer: Self-pay | Admitting: Emergency Medicine

## 2020-01-27 ENCOUNTER — Other Ambulatory Visit: Payer: Self-pay

## 2020-01-27 DIAGNOSIS — M79605 Pain in left leg: Secondary | ICD-10-CM

## 2020-01-27 DIAGNOSIS — L03116 Cellulitis of left lower limb: Secondary | ICD-10-CM

## 2020-01-27 DIAGNOSIS — L089 Local infection of the skin and subcutaneous tissue, unspecified: Secondary | ICD-10-CM

## 2020-01-27 MED ORDER — NAPROXEN 500 MG PO TABS: 500.0000 mg | ORAL_TABLET | Freq: Two times a day (BID) | ORAL | 0 refills | Status: AC

## 2020-01-27 MED ORDER — DOXYCYCLINE HYCLATE 100 MG PO CAPS: 100.0000 mg | ORAL_CAPSULE | Freq: Two times a day (BID) | ORAL | 0 refills | Status: DC

## 2020-01-27 MED ORDER — BACITRACIN ZINC 500 UNIT/GM EX OINT
TOPICAL_OINTMENT | CUTANEOUS | Status: AC
  Filled 2020-01-27: qty 28.35

## 2020-01-27 NOTE — ED Triage Notes (Signed)
Pt here for increased swelling, pain and redness to left lower leg after injuring 4 days ago on bush [name]

## 2020-01-27 NOTE — Discharge Instructions (Signed)
Please change your dressing 2-3 times daily. Use non-stick gauze. Alternate between wet (applying ointment) and dry dressings (no ointments). Each time you change your dressing, press on the wound to your pain tolerance to drain pus. Make sure you clean gently around the perimeter of the wound and the wound itself with gentle soap and warm water.   Pat your wound dry and let it air out if possible for 1-2 hours before reapplying another dressing. Once your wound scabs over completely, you do not need to keep applying dressings.

## 2020-01-27 NOTE — ED Provider Notes (Signed)
[facility]   MRN: [id number] DOB: Jan 18, 1956  Subjective:   [name] is a 64 y.o. female presenting for 2-day history of worsening left leg wound.  Patient was doing yard work and suffered an injury from the brush she was clearing.  It actually punctured her leg into spots.  She cleaned her wound and has tried to keep it covered with Band-Aids.  It is progressed to redness, swelling and drainage of pus.  She is able to walk but with pain.  Still has sensation in her leg.  Was working around [place] but has not had any itching or did not have it come into contact with her skin.  No current facility-administered medications for this encounter.  Current Outpatient Medications:  .  amphetamine-dextroamphetamine (ADDERALL) 20 MG tablet, Take 20 mg by mouth every morning., Disp: , Rfl: 0 .  fluticasone (FLONASE) 50 MCG/ACT nasal spray, Place 2 sprays into the nose daily as needed for allergies. , Disp: , Rfl:  .  LISINOPRIL PO, Take by mouth., Disp: , Rfl:    Allergies  Allergen Reactions  . Sulfa Antibiotics Other (See Comments)    Unknown was as a child    Past Medical History:  Diagnosis Date  . ADD (attention deficit disorder) without hyperactivity   . Cancer ([place])    Melanoma  . Complication of anesthesia   . Hypertension   . PONV (postoperative nausea and vomiting)      Past Surgical History:  Procedure Laterality Date  . ABDOMINAL HYSTERECTOMY    . AUGMENTATION MAMMAPLASTY    . JOINT REPLACEMENT    . left hip replacement Left 06/17/2017  . MELANOMA EXCISION WITH SENTINEL LYMPH NODE BIOPSY     1995  . TOTAL HIP ARTHROPLASTY Left 06/17/2017   Procedure: LEFT TOTAL HIP ARTHROPLASTY ANTERIOR APPROACH;  Surgeon: Gaynelle Arabian, MD;  Location: [facility];  Service: Orthopedics;  Laterality: Left;    History reviewed. No pertinent family history.  Social History   Tobacco Use  . Smoking status: Former Smoker    Quit date: 1985    Years since quitting: 36.5    . Smokeless tobacco: Never Used  Vaping Use  . Vaping Use: Never used  Substance Use Topics  . Alcohol use: Yes    Alcohol/week: 4.0 standard drinks    Types: 4 Glasses of wine per week    Comment: occasionally  . Drug use: No    ROS   Objective:   Vitals: BP (!) 155/88 (BP Location: Right Arm)   Pulse 97   Temp 98.5 F (36.9 C) (Oral)   Resp 18   SpO2 97%   Physical Exam Constitutional:      General: She is not in acute distress.    Appearance: Normal appearance. She is well-developed. She is not ill-appearing, toxic-appearing or diaphoretic.  HENT:     Head: Normocephalic and atraumatic.     Nose: Nose normal.     Mouth/Throat:     Mouth: Mucous membranes are moist.     Pharynx: Oropharynx is clear.  Eyes:     General: No scleral icterus.       Right eye: No discharge.        Left eye: No discharge.     Extraocular Movements: Extraocular movements intact.     Conjunctiva/sclera: Conjunctivae normal.     Pupils: Pupils are equal, round, and reactive to light.  Cardiovascular:     Rate and Rhythm: Normal rate.  Pulmonary:  Effort: Pulmonary effort is normal.  Musculoskeletal:       Legs:     Comments: Dorsalis pedis 2+, posterior tibial 2+.  Sensation intact.  Skin:    General: Skin is warm and dry.  Neurological:     General: No focal deficit present.     Mental Status: She is alert and oriented to person, place, and time.  Psychiatric:        Mood and Affect: Mood normal.        Behavior: Behavior normal.        Thought Content: Thought content normal.        Judgment: Judgment normal.           Assessment and Plan :   PDMP not reviewed this encounter.  1. Left leg pain   2. Wound infection   3. Cellulitis of left lower extremity     No evidence of compartment syndrome.  Patient has severe infection, open wound with cellulitis.  Counseled patient extensively on wound care.  We will have her alternate between wet and dry dressings.   Start doxycycline, naproxen for pain control.  Patient is to follow-up in 2 days for recheck. Counseled patient on potential for adverse effects with medications prescribed/recommended today, strict ER and return-to-clinic precautions discussed, patient verbalized understanding.    Jaynee Eagles, PA-C 01/27/20 1125

## 2020-01-30 ENCOUNTER — Encounter (HOSPITAL_COMMUNITY): Payer: Self-pay

## 2020-01-30 ENCOUNTER — Other Ambulatory Visit: Payer: Self-pay

## 2020-01-30 ENCOUNTER — Ambulatory Visit (HOSPITAL_COMMUNITY)
Admission: RE | Admit: 2020-01-30 | Discharge: 2020-01-30 | Disposition: A | Discharge status: Home / Self Care | Payer: 59 | Source: Ambulatory Visit | Attending: Family Medicine | Admitting: Family Medicine

## 2020-01-30 VITALS — BP 132/66 | HR 72 | Temp 98.2°F | Resp 16

## 2020-01-30 DIAGNOSIS — Z5189 Encounter for other specified aftercare: Secondary | ICD-10-CM

## 2020-01-30 DIAGNOSIS — L089 Local infection of the skin and subcutaneous tissue, unspecified: Secondary | ICD-10-CM

## 2020-01-30 DIAGNOSIS — T148XXA Other injury of unspecified body region, initial encounter: Secondary | ICD-10-CM

## 2020-01-30 NOTE — Discharge Instructions (Signed)
Complete course of doxycycline Continue wound care, keeping clean and dry Follow-up for any concerns regarding healing of wound

## 2020-01-30 NOTE — ED Provider Notes (Signed)
[facility]    CSN: [id number] Arrival date & time: 01/30/20  1048      History   Chief Complaint Chief Complaint  Patient presents with  . Wound Check    HPI [name] is a 64 y.o. female history of hypertension, presenting today for evaluation of wound check.  Patient was seen here approximately 3 days ago for initial visit for wound infection.  Sustained wound from garden and subsequently developed cellulitis/infection.  Was initiated on doxycycline and recommended wound care.  Patient reports that she has seen moderate improvement feels area is less painful and swollen, walking more manageable.  Continues to drain.  Has been alternating wet and dry dressings.  Denies fevers.  HPI  Past Medical History:  Diagnosis Date  . ADD (attention deficit disorder) without hyperactivity   . Cancer ([place])    Melanoma  . Complication of anesthesia   . Hypertension   . PONV (postoperative nausea and vomiting)     Patient Active Problem List   Diagnosis Date Noted  . OA (osteoarthritis) of hip 06/17/2017  . Left hip pain 05/04/2016    Past Surgical History:  Procedure Laterality Date  . ABDOMINAL HYSTERECTOMY    . AUGMENTATION MAMMAPLASTY    . JOINT REPLACEMENT    . left hip replacement Left 06/17/2017  . MELANOMA EXCISION WITH SENTINEL LYMPH NODE BIOPSY     1995  . TOTAL HIP ARTHROPLASTY Left 06/17/2017   Procedure: LEFT TOTAL HIP ARTHROPLASTY ANTERIOR APPROACH;  Surgeon: Gaynelle Arabian, MD;  Location: [facility];  Service: Orthopedics;  Laterality: Left;    OB History   No obstetric history on file.      Home Medications    Prior to Admission medications   Medication Sig Start Date End Date Taking? Authorizing Provider  amphetamine-dextroamphetamine (ADDERALL) 20 MG tablet Take 20 mg by mouth every morning. 03/12/16   [provider]  doxycycline (VIBRAMYCIN) 100 MG capsule Take 1 capsule (100 mg total) by mouth 2 (two) times daily. 01/27/20   Jaynee Eagles, PA-C  fluticasone (FLONASE) 50 MCG/ACT nasal spray Place 2 sprays into the nose daily as needed for allergies.     [provider]  LISINOPRIL PO Take by mouth.    [provider]  naproxen (NAPROSYN) 500 MG tablet Take 1 tablet (500 mg total) by mouth 2 (two) times daily with a meal. 01/27/20   Jaynee Eagles, PA-C    Family History History reviewed. No pertinent family history.  Social History Social History   Tobacco Use  . Smoking status: Former Smoker    Quit date: 1985    Years since quitting: 36.5  . Smokeless tobacco: Never Used  Vaping Use  . Vaping Use: Never used  Substance Use Topics  . Alcohol use: Yes    Alcohol/week: 4.0 standard drinks    Types: 4 Glasses of wine per week    Comment: occasionally  . Drug use: No     Allergies   Sulfa antibiotics   Review of Systems Review of Systems  Constitutional: Negative for fatigue and fever.  Eyes: Negative for visual disturbance.  Respiratory: Negative for shortness of breath.   Cardiovascular: Negative for chest pain.  Gastrointestinal: Negative for abdominal pain, nausea and vomiting.  Musculoskeletal: Negative for arthralgias and joint swelling.  Skin: Positive for color change and wound. Negative for rash.  Neurological: Negative for dizziness, weakness, light-headedness and headaches.     Physical Exam Triage Vital Signs ED Triage  Vitals  Enc Vitals Group     BP 01/30/20 1120 132/66     Pulse Rate 01/30/20 1120 72     Resp 01/30/20 1120 16     Temp 01/30/20 1120 98.2 F (36.8 C)     Temp Source 01/30/20 1120 Oral     SpO2 01/30/20 1120 100 %     Weight --      Height --      Head Circumference --      Peak Flow --      Pain Score 01/30/20 1119 2     Pain Loc --      Pain Edu? --      Excl. in [place]? --    No data found.  Updated Vital Signs BP 132/66 (BP Location: Right Arm)   Pulse 72   Temp 98.2 F (36.8 C) (Oral)   Resp 16   SpO2 100%   Visual Acuity Right Eye  Distance:   Left Eye Distance:   Bilateral Distance:    Right Eye Near:   Left Eye Near:    Bilateral Near:     Physical Exam Vitals and nursing note reviewed.  Constitutional:      Appearance: She is well-developed.     Comments: No acute distress  HENT:     Head: Normocephalic and atraumatic.     Nose: Nose normal.  Eyes:     Conjunctiva/sclera: Conjunctivae normal.  Cardiovascular:     Rate and Rhythm: Normal rate.  Pulmonary:     Effort: Pulmonary effort is normal. No respiratory distress.  Abdominal:     General: There is no distension.  Musculoskeletal:        General: Normal range of motion.     Cervical back: Neck supple.  Skin:    General: Skin is warm and dry.     Comments: Left anterior lower leg with 3.5 x 1.5 cm wound with active pustular drainage, mild surrounding erythema and faint induration noted  More distally linear 3.5 cm superficial abrasion with mild surrounding erythema, no drainage  Neurological:     Mental Status: She is alert and oriented to person, place, and time.      UC Treatments / Results  Labs (all labs ordered are listed, but only abnormal results are displayed) Labs Reviewed - No data to display  EKG   Radiology No results found.  Procedures Procedures (including critical care time)  Medications Ordered in UC Medications - No data to display  Initial Impression / Assessment and Plan / UC Course  I have reviewed the triage vital signs and the nursing notes.  Pertinent labs & imaging results that were available during my care of the patient were reviewed by me and considered in my medical decision making (see chart for details).     Wound appears to be healing, does not appear as deep as picture from prior note.  Patient subjectively improving.  Will have complete course of doxycycline with continued wound care and monitoring of wound.  Discussed strict return precautions. Patient verbalized understanding and is  agreeable with plan.  Final Clinical Impressions(s) / UC Diagnoses   Final diagnoses:  Wound infection  Visit for wound check     Discharge Instructions     Complete course of doxycycline Continue wound care, keeping clean and dry Follow-up for any concerns regarding healing of wound   ED Prescriptions    None     PDMP not reviewed this encounter.  Janith Lima, PA-C 01/30/20 1143

## 2020-01-30 NOTE — ED Triage Notes (Signed)
Pt presents to UC for wound check on left lower leg. Pt seen on 7/16 for initial encounter and was instructed to come in for wound check. Pt states wound is progressing. Pt denies fever, chills, n/v. Pt states wound is warm to touch but pain and  redness is improving.

## 2020-11-05 ENCOUNTER — Encounter: Payer: Self-pay | Admitting: Gastroenterology

## 2021-08-28 ENCOUNTER — Ambulatory Visit (AMBULATORY_SURGERY_CENTER): Payer: Medicare Other | Admitting: *Deleted

## 2021-08-28 ENCOUNTER — Other Ambulatory Visit: Payer: Self-pay

## 2021-08-28 VITALS — Ht 65.5 in | Wt 117.0 lb

## 2021-08-28 DIAGNOSIS — Z1211 Encounter for screening for malignant neoplasm of colon: Secondary | ICD-10-CM

## 2021-08-28 NOTE — Progress Notes (Signed)
No egg or soy allergy known to patient  Pt states has had issues with nausea and vomiting after anesthesia in the past. Patient denies ever being told they had issues or difficulty with intubation  No FH of Malignant Hyperthermia Pt is not on diet pills Pt is not on  home 02  Pt is not on blood thinners  Pt denies issues with constipation  No A fib or A flutter  Pt is fully vaccinated  for Covid    Due to the COVID-19 pandemic we are asking patients to follow certain guidelines in PV and the [facility]   Pt aware of COVID protocols and [facility] guidelines   PV completed over the phone. Pt verified name, DOB, address and insurance during PV today.  Pt mailed instruction packet with copy of consent form to read and not return, and instructions.  Pt encouraged to call with questions or issues.  If pt has My chart, procedure instructions sent via My Chart

## 2021-09-09 ENCOUNTER — Encounter: Payer: Self-pay | Admitting: Gastroenterology

## 2021-09-12 ENCOUNTER — Ambulatory Visit (AMBULATORY_SURGERY_CENTER): Payer: Medicare Other | Admitting: Gastroenterology

## 2021-09-12 ENCOUNTER — Encounter: Payer: Self-pay | Admitting: Gastroenterology

## 2021-09-12 VITALS — BP 118/71 | HR 83 | Temp 97.9°F | Resp 20 | Ht 66.0 in | Wt 117.0 lb

## 2021-09-12 DIAGNOSIS — Z1211 Encounter for screening for malignant neoplasm of colon: Secondary | ICD-10-CM

## 2021-09-12 DIAGNOSIS — D120 Benign neoplasm of cecum: Secondary | ICD-10-CM | POA: Diagnosis not present

## 2021-09-12 DIAGNOSIS — D122 Benign neoplasm of ascending colon: Secondary | ICD-10-CM

## 2021-09-12 MED ORDER — SODIUM CHLORIDE 0.9 % IV SOLN: 500.0000 mL | Freq: Once | INTRAVENOUS | Status: DC

## 2021-09-12 NOTE — Patient Instructions (Signed)
Please read handouts provided. ?Continue present medications. ?Await pathology results. ?High Fiber Diet. ? ? ?YOU HAD AN ENDOSCOPIC PROCEDURE TODAY AT [facility]:   Refer to the procedure report that was given to you for any specific questions about what was found during the examination.  If the procedure report does not answer your questions, please call your gastroenterologist to clarify.  If you requested that your care partner not be given the details of your procedure findings, then the procedure report has been included in a sealed envelope for you to review at your convenience later. ? ?YOU SHOULD EXPECT: Some feelings of bloating in the abdomen. Passage of more gas than usual.  Walking can help get rid of the air that was put into your GI tract during the procedure and reduce the bloating. If you had a lower endoscopy (such as a colonoscopy or flexible sigmoidoscopy) you may notice spotting of blood in your stool or on the toilet paper. If you underwent a bowel prep for your procedure, you may not have a normal bowel movement for a few days. ? ?Please Note:  You might notice some irritation and congestion in your nose or some drainage.  This is from the oxygen used during your procedure.  There is no need for concern and it should clear up in a day or so. ? ?SYMPTOMS TO REPORT IMMEDIATELY: ? ?Following lower endoscopy (colonoscopy or flexible sigmoidoscopy): ? Excessive amounts of blood in the stool ? Significant tenderness or worsening of abdominal pains ? Swelling of the abdomen that is new, acute ? Fever of 100?F or higher ? ? ?For urgent or emergent issues, a gastroenterologist can be reached at any hour by calling [phone]. ?Do not use MyChart messaging for urgent concerns.  ? ? ?DIET:  We do recommend a small meal at first, but then you may proceed to your regular diet.  Drink plenty of fluids but you should avoid alcoholic beverages for 24 hours. ? ?ACTIVITY:  You should plan  to take it easy for the rest of today and you should NOT DRIVE or use heavy machinery until tomorrow (because of the sedation medicines used during the test).   ? ?FOLLOW UP: ?Our staff will call the number listed on your records 48-72 hours following your procedure to check on you and address any questions or concerns that you may have regarding the information given to you following your procedure. If we do not reach you, we will leave a message.  We will attempt to reach you two times.  During this call, we will ask if you have developed any symptoms of COVID 19. If you develop any symptoms (ie: fever, flu-like symptoms, shortness of breath, cough etc.) before then, please call [phone].  If you test positive for Covid 19 in the 2 weeks post procedure, please call and report this information to [place].   ? ?If any biopsies were taken you will be contacted by phone or by letter within the next 1-3 weeks.  Please call us at [phone] if you have not heard about the biopsies in 3 weeks.  ? ? ?SIGNATURES/CONFIDENTIALITY: ?You and/or your care partner have signed paperwork which will be entered into your electronic medical record.  These signatures attest to the fact that that the information above on your After Visit Summary has been reviewed and is understood.  Full responsibility of the confidentiality of this discharge information lies with you and/or your care-partner.  ?

## 2021-09-12 NOTE — Progress Notes (Signed)
? ?  Referring Provider: Greig Right, MD ?Primary Care Physician:  Greig Right, MD ? ?Reason for Procedure:  Colon cancer screening ? ? ?IMPRESSION:  ?Need for colon cancer screening ?Appropriate candidate for monitored anesthesia care ? ?PLAN: ?Colonoscopy in the [facility] today ? ? ?HPI: [name] is a 66 y.o. female presents for screening colonoscopy. ? ?Normal colonoscopy with Dr. Olevia Perches 11/06/10.  ? ?No baseline GI symptoms.  ? ?No known family history of colon cancer or polyps. No family history of uterine/endometrial cancer, pancreatic cancer or gastric/stomach cancer. ? ? ?Past Medical History:  ?Diagnosis Date  ? ADD (attention deficit disorder) without hyperactivity   ? Cancer [name])   ? Melanoma  ? Complication of anesthesia   ? Hypertension   ? PONV (postoperative nausea and vomiting)   ? ? ?Past Surgical History:  ?Procedure Laterality Date  ? ABDOMINAL HYSTERECTOMY    ? AUGMENTATION MAMMAPLASTY    ? JOINT REPLACEMENT    ? left hip replacement Left 06/17/2017  ? MELANOMA EXCISION WITH SENTINEL LYMPH NODE BIOPSY    ? 1995  ? TOTAL HIP ARTHROPLASTY Left 06/17/2017  ? Procedure: LEFT TOTAL HIP ARTHROPLASTY ANTERIOR APPROACH;  Surgeon: Gaynelle Arabian, MD;  Location: [facility];  Service: Orthopedics;  Laterality: Left;  ? ? ?Current Outpatient Medications  ?Medication Sig Dispense Refill  ? amphetamine-dextroamphetamine (ADDERALL) 20 MG tablet Take 20 mg by mouth every morning.  0  ? fluticasone (FLONASE) 50 MCG/ACT nasal spray Place 2 sprays into the nose daily as needed for allergies.     ? LISINOPRIL PO Take by mouth.    ? progesterone (PROMETRIUM) 100 MG capsule Take 100 mg by mouth at bedtime.    ? bimatoprost (LATISSE) 0.03 % ophthalmic solution APPLY TO UPPER LASHES FOR ONE MONTH THEN TWICE A WEEK AFTER    ? naproxen (NAPROSYN) 500 MG tablet Take 1 tablet (500 mg total) by mouth 2 (two) times daily with a meal. (Patient not taking: Reported on 08/28/2021) 30 tablet 0  ? ?Current Facility-Administered  Medications  ?Medication Dose Route Frequency Provider Last Rate Last Admin  ? 0.9 %  sodium chloride infusion  500 mL Intravenous Once Thornton Park, MD      ? ? ?Allergies as of 09/12/2021 - Review Complete 09/12/2021  ?Allergen Reaction Noted  ? Sulfa antibiotics Other (See Comments) 10/23/2010  ? ? ?Family History  ?Problem Relation Age of Onset  ? Colon cancer Neg Hx   ? Colon polyps Neg Hx   ? Esophageal cancer Neg Hx   ? Stomach cancer Neg Hx   ? Rectal cancer Neg Hx   ? ? ? ?Physical Exam: ?General:   Alert,  well-nourished, pleasant and cooperative in NAD ?Head:  Normocephalic and atraumatic. ?Eyes:  Sclera clear, no icterus.   Conjunctiva pink. ?Mouth:  No deformity or lesions.   ?Neck:  Supple; no masses or thyromegaly. ?Lungs:  Clear throughout to auscultation.   No wheezes. ?Heart:  Regular rate and rhythm; no murmurs. ?Abdomen:  Soft, non-tender, nondistended, normal bowel sounds, no rebound or guarding.  ?Msk:  Symmetrical. No boney deformities ?LAD: No inguinal or umbilical LAD ?Extremities:  No clubbing or edema. ?Neurologic:  Alert and  oriented x4;  grossly nonfocal ?Skin:  No obvious rash or bruise. ?Psych:  Alert and cooperative. Normal mood and affect. ? ? ? ? ?Studies/Results: ?No results found. ? ? ? ?Pranshu Lyster L. Tarri Glenn, MD, MPH ?09/12/2021, 3:25 PM ? ? ? ?  ?

## 2021-09-12 NOTE — Progress Notes (Signed)
C.W. vital signs. 

## 2021-09-12 NOTE — Progress Notes (Signed)
A and O x3. Report to RN. Tolerated MAC anesthesia well. 

## 2021-09-12 NOTE — Op Note (Signed)
[place] ?Patient Name: [name] ?Procedure Date: 09/12/2021 3:28 PM ?MRN: [id number] ?Endoscopist: Thornton Park MD, MD ?Age: 66 ?Referring MD:  ?Date of Birth: 09-24-1955 ?Gender: Female ?Account #: [id number] ?Procedure:                Colonoscopy ?Indications:              Screening for colorectal malignant neoplasm ?                          Normal colonoscopy with Dr. Olevia Perches 2012 ?                          No known family history of colon cancer or polyps ?Medicines:                Monitored Anesthesia Care ?Procedure:                Pre-Anesthesia Assessment: ?                          - Prior to the procedure, a History and Physical  ?                          was performed, and patient medications and  ?                          allergies were reviewed. The patient's tolerance of  ?                          previous anesthesia was also reviewed. The risks  ?                          and benefits of the procedure and the sedation  ?                          options and risks were discussed with the patient.  ?                          All questions were answered, and informed consent  ?                          was obtained. Prior Anticoagulants: The patient has  ?                          taken no previous anticoagulant or antiplatelet  ?                          agents. ASA Grade Assessment: II - A patient with  ?                          mild systemic disease. After reviewing the risks  ?                          and benefits, the patient was deemed in  ?  satisfactory condition to undergo the procedure. ?                          After obtaining informed consent, the colonoscope  ?                          was passed under direct vision. Throughout the  ?                          procedure, the patient's blood pressure, pulse, and  ?                          oxygen saturations were monitored continuously. The  ?                          CF [id number] #[id number] was  introduced through the anus  ?                          and advanced to the 3 cm into the ileum. A second  ?                          forward view of the right colon was performed. The  ?                          colonoscopy was technically difficult and complex  ?                          due to significant looping and a tortuous colon.  ?                          Successful completion of the procedure was aided by  ?                          changing the patient's position, withdrawing and  ?                          reinserting the scope and applying abdominal  ?                          pressure. The patient tolerated the procedure well.  ?                          The quality of the bowel preparation was good. The  ?                          terminal ileum, ileocecal valve, appendiceal  ?                          orifice, and rectum were photographed. ?Scope In: 3:36:39 PM ?Scope Out: 3:56:51 PM ?Scope Withdrawal Time: 0 hours 12 minutes 14 seconds  ?Total Procedure Duration: 0 hours 20 minutes 12 seconds  ?Findings:                 The perianal and  digital rectal examinations were  ?                          normal. ?                          A 4 mm polyp was found in the ascending colon. The  ?                          polyp was flat. The polyp was removed with a cold  ?                          snare. Resection and retrieval were complete.  ?                          Estimated blood loss was minimal. ?                          Two flat polyps were found in the cecum. The polyps  ?                          were 2 to 4 mm in size. These polyps were removed  ?                          with a cold snare. Resection and retrieval were  ?                          complete. Estimated blood loss was minimal. ?                          Multiple small and large-mouthed diverticula were  ?                          found in the sigmoid colon and distal descending  ?                          colon. ?Complications:             No immediate complications. Estimated blood loss:  ?                          Minimal. ?Estimated Blood Loss:     Estimated blood loss was minimal. ?Impression:               - One 4 mm polyp in the ascending colon, removed  ?                          with a cold snare. Resected and retrieved. ?                          - Two 2 to 4 mm polyps in the cecum, removed with a  ?                          cold snare. Resected and retrieved. ?                          -  Left sided diverticulosis. ?                          - The examination was otherwise normal on direct  ?                          and retroflexion views. ?Recommendation:           - Patient has a contact number available for  ?                          emergencies. The signs and symptoms of potential  ?                          delayed complications were discussed with the  ?                          patient. Return to normal activities tomorrow.  ?                          Written discharge instructions were provided to the  ?                          patient. ?                          - Resume previous diet. High fiber diet encouraged. ?                          - Continue present medications. ?                          - Await pathology results. ?                          - Repeat colonoscopy date to be determined after  ?                          pending pathology results are reviewed for  ?                          surveillance. ?                          - Emerging evidence supports eating a diet of  ?                          fruits, vegetables, grains, calcium, and yogurt  ?                          while reducing red meat and alcohol may reduce the  ?                          risk of colon cancer. ?                          - Thank you  for allowing me to be involved in your  ?                          colon cancer prevention. ?Thornton Park MD, MD ?09/12/2021 4:02:37 PM ?This report has been signed electronically. ?

## 2021-09-12 NOTE — Progress Notes (Signed)
Pt's states no medical or surgical changes since previsit or office visit. 

## 2021-09-16 ENCOUNTER — Telehealth: Payer: Self-pay | Admitting: *Deleted

## 2021-09-16 NOTE — Telephone Encounter (Signed)
First attempt, left VM.  

## 2021-09-16 NOTE — Telephone Encounter (Signed)
No answer on second attempt follow up call.  ? ?

## 2021-09-29 ENCOUNTER — Encounter: Payer: Self-pay | Admitting: Gastroenterology

## 2022-12-01 ENCOUNTER — Ambulatory Visit: Payer: Medicare Other | Admitting: Physical Therapy

## 2024-06-03 ENCOUNTER — Ambulatory Visit
Admission: RE | Admit: 2024-06-03 | Discharge: 2024-06-03 | Disposition: A | Discharge status: Home / Self Care | Source: Ambulatory Visit | Attending: Urgent Care | Admitting: Urgent Care

## 2024-06-03 VITALS — BP 127/84 | HR 81 | Temp 97.7°F | Resp 16

## 2024-06-03 DIAGNOSIS — N3001 Acute cystitis with hematuria: Secondary | ICD-10-CM | POA: Insufficient documentation

## 2024-06-03 DIAGNOSIS — R350 Frequency of micturition: Secondary | ICD-10-CM | POA: Insufficient documentation

## 2024-06-03 LAB — POCT URINE DIPSTICK
Bilirubin, UA: NEGATIVE
Glucose, UA: NEGATIVE mg/dL
Ketones, POC UA: NEGATIVE mg/dL
Nitrite, UA: NEGATIVE
POC PROTEIN,UA: 100 — AB
Spec Grav, UA: 1.02 (ref 1.010–1.025)
Urobilinogen, UA: 1 U/dL
pH, UA: 6.5 (ref 5.0–8.0)

## 2024-06-03 MED ORDER — CEPHALEXIN 500 MG PO CAPS: 500.0000 mg | ORAL_CAPSULE | Freq: Two times a day (BID) | ORAL | 0 refills | Status: AC

## 2024-06-03 NOTE — ED Triage Notes (Signed)
 Pt states urinary frequency and burning with urination for the past 2 days.  States she took and amoxicillin this am.

## 2024-06-03 NOTE — Discharge Instructions (Signed)
 Please start cephalexin  to address an urinary tract infection. Make sure you hydrate very well with plain water and a quantity of 80 ounces of water a day.  Please limit drinks that are considered urinary irritants such as fruit juices, soda, sweet tea, coffee, artifical sweetened drinks, energy drinks, alcohol.  These can worsen your urinary and genital symptoms but also be the source of them.  I will let you know about your urine culture results through MyChart to see if we need to prescribe or change your antibiotics based off of those results.

## 2024-06-03 NOTE — ED Provider Notes (Signed)
 [facility]  Note:  This document was prepared using Conservation officer, historic buildings and may include unintentional dictation errors.  MRN: [id number] DOB: Jul 07, 1956  Subjective:   [name] is a 68 y.o. female presenting for 2-day history of acute onset dysuria, urinary frequency and urgency.  Denies fever, n/v, abdominal pain, pelvic pain, rashes, confusion, hematuria, vaginal discharge.  Has previously had UTIs.  Took amoxicillin this morning leftover from previous prescription.  No current facility-administered medications for this encounter.  Current Outpatient Medications:    amphetamine -dextroamphetamine  (ADDERALL) 20 MG tablet, Take 20 mg by mouth every morning., Disp: , Rfl: 0   bimatoprost (LATISSE) 0.03 % ophthalmic solution, APPLY TO UPPER LASHES FOR ONE MONTH THEN TWICE A WEEK AFTER, Disp: , Rfl:    fluticasone  (FLONASE ) 50 MCG/ACT nasal spray, Place 2 sprays into the nose daily as needed for allergies. , Disp: , Rfl:    LISINOPRIL PO, Take by mouth., Disp: , Rfl:    naproxen  (NAPROSYN ) 500 MG tablet, Take 1 tablet (500 mg total) by mouth 2 (two) times daily with a meal. (Patient not taking: Reported on 08/28/2021), Disp: 30 tablet, Rfl: 0   progesterone (PROMETRIUM) 100 MG capsule, Take 100 mg by mouth at bedtime., Disp: , Rfl:    Allergies  Allergen Reactions   Sulfa Antibiotics Other (See Comments)    Unknown was as a child    Past Medical History:  Diagnosis Date   ADD (attention deficit disorder) without hyperactivity    Cancer (HCC)    Melanoma   Complication of anesthesia    Hypertension    PONV (postoperative nausea and vomiting)      Past Surgical History:  Procedure Laterality Date   ABDOMINAL HYSTERECTOMY     AUGMENTATION MAMMAPLASTY     JOINT REPLACEMENT     left hip replacement Left 06/17/2017   MELANOMA EXCISION WITH SENTINEL LYMPH NODE BIOPSY     1995   TOTAL HIP ARTHROPLASTY Left 06/17/2017   Procedure: LEFT TOTAL  HIP ARTHROPLASTY ANTERIOR APPROACH;  Surgeon: Melodi Lerner, MD;  Location: [facility];  Service: Orthopedics;  Laterality: Left;    Family History  Problem Relation Age of Onset   Colon cancer Neg Hx    Colon polyps Neg Hx    Esophageal cancer Neg Hx    Stomach cancer Neg Hx    Rectal cancer Neg Hx     Social History   Tobacco Use   Smoking status: Former    Current packs/day: 0.00    Types: Cigarettes    Quit date: 1985    Years since quitting: 40.9   Smokeless tobacco: Never  Vaping Use   Vaping status: Never Used  Substance Use Topics   Alcohol use: Yes    Alcohol/week: 4.0 standard drinks of alcohol    Types: 4 Glasses of wine per week    Comment: occasionally   Drug use: No    ROS   Objective:   Vitals: BP 127/84 (BP Location: Left Arm)   Pulse 81   Temp 97.7 F (36.5 C) (Oral)   Resp 16   SpO2 93%   Physical Exam Constitutional:      General: She is not in acute distress.    Appearance: Normal appearance. She is well-developed. She is not ill-appearing, toxic-appearing or diaphoretic.  HENT:     Head: Normocephalic and atraumatic.     Nose: Nose normal.     Mouth/Throat:     Mouth:  Mucous membranes are moist.  Eyes:     General: No scleral icterus.       Right eye: No discharge.        Left eye: No discharge.     Extraocular Movements: Extraocular movements intact.     Conjunctiva/sclera: Conjunctivae normal.  Cardiovascular:     Rate and Rhythm: Normal rate.  Pulmonary:     Effort: Pulmonary effort is normal.  Abdominal:     General: Bowel sounds are normal. There is no distension.     Palpations: Abdomen is soft. There is no mass.     Tenderness: There is no abdominal tenderness. There is no right CVA tenderness, left CVA tenderness, guarding or rebound.  Skin:    General: Skin is warm and dry.  Neurological:     General: No focal deficit present.     Mental Status: She is alert and oriented to person, place, and time.  Psychiatric:         Mood and Affect: Mood normal.        Behavior: Behavior normal.        Thought Content: Thought content normal.        Judgment: Judgment normal.     Results for orders placed or performed during the hospital encounter of 06/03/24 (from the past 24 hours)  POCT URINE DIPSTICK     Status: Abnormal   Collection Time: 06/03/24  3:35 PM  Result Value Ref Range   Color, UA other (A) yellow   Clarity, UA hazy (A) clear   Glucose, UA negative negative mg/dL   Bilirubin, UA negative negative   Ketones, POC UA negative negative mg/dL   Spec Grav, UA 8.979 8.989 - 1.025   Blood, UA large (A) negative   pH, UA 6.5 5.0 - 8.0   POC PROTEIN,UA =100 (A) negative, trace   Urobilinogen, UA 1.0 0.2 or 1.0 E.U./dL   Nitrite, UA Negative Negative   Leukocytes, UA Moderate (2+) (A) Negative    Assessment and Plan :   PDMP not reviewed this encounter.  1. Acute cystitis with hematuria   2. Urinary frequency    Start cephalexin  to cover for acute cystitis, urine culture pending.  Recommended consistent hydration, limiting urinary irritants. Counseled patient on potential for adverse effects with medications prescribed/recommended today, ER and return-to-clinic precautions discussed, patient verbalized understanding.    Christopher Savannah, [place] 06/03/24 [id number]

## 2024-06-05 LAB — URINE CULTURE: Culture: <10000 — AB

## 2024-06-06 ENCOUNTER — Ambulatory Visit (HOSPITAL_COMMUNITY): Payer: Self-pay
